# Patient Record
Sex: Female | Born: 1950 | Race: White | Hispanic: No | Marital: Married | Smoking: Current every day smoker
Health system: Southern US, Community
[De-identification: ages and names within clinical notes are randomized; demographics above are authoritative.]

## PROBLEM LIST (undated history)

## (undated) DIAGNOSIS — J189 Pneumonia, unspecified organism: Secondary | ICD-10-CM

## (undated) DIAGNOSIS — Z974 Presence of external hearing-aid: Secondary | ICD-10-CM

## (undated) DIAGNOSIS — F32A Depression, unspecified: Secondary | ICD-10-CM

## (undated) DIAGNOSIS — Z973 Presence of spectacles and contact lenses: Secondary | ICD-10-CM

## (undated) DIAGNOSIS — K529 Noninfective gastroenteritis and colitis, unspecified: Secondary | ICD-10-CM

## (undated) DIAGNOSIS — Z972 Presence of dental prosthetic device (complete) (partial): Secondary | ICD-10-CM

## (undated) DIAGNOSIS — F419 Anxiety disorder, unspecified: Secondary | ICD-10-CM

## (undated) HISTORY — PX: COLONOSCOPY WITH PROPOFOL: SHX5780

---

## 1993-02-25 HISTORY — PX: BREAST CYST ASPIRATION: SHX578

## 1997-02-25 HISTORY — PX: REDUCTION MAMMAPLASTY: SUR839

## 2004-04-25 ENCOUNTER — Ambulatory Visit: Payer: Self-pay | Admitting: Otolaryngology

## 2004-09-26 ENCOUNTER — Ambulatory Visit: Payer: Self-pay | Admitting: Internal Medicine

## 2005-10-02 ENCOUNTER — Ambulatory Visit: Payer: Self-pay | Admitting: Internal Medicine

## 2006-03-10 ENCOUNTER — Ambulatory Visit: Payer: Self-pay | Admitting: Unknown Physician Specialty

## 2006-09-17 ENCOUNTER — Other Ambulatory Visit: Payer: Self-pay

## 2006-09-17 ENCOUNTER — Emergency Department: Payer: Self-pay | Admitting: Emergency Medicine

## 2006-10-06 ENCOUNTER — Ambulatory Visit: Payer: Self-pay | Admitting: Internal Medicine

## 2007-10-07 ENCOUNTER — Ambulatory Visit: Payer: Self-pay | Admitting: Internal Medicine

## 2008-10-17 ENCOUNTER — Ambulatory Visit: Payer: Self-pay | Admitting: Internal Medicine

## 2009-08-22 ENCOUNTER — Ambulatory Visit: Payer: Self-pay | Admitting: Surgery

## 2009-08-22 ENCOUNTER — Ambulatory Visit: Payer: Self-pay | Admitting: Internal Medicine

## 2009-10-23 ENCOUNTER — Ambulatory Visit: Payer: Self-pay | Admitting: Internal Medicine

## 2010-11-06 ENCOUNTER — Ambulatory Visit: Payer: Self-pay | Admitting: Internal Medicine

## 2011-11-12 ENCOUNTER — Ambulatory Visit: Payer: Self-pay | Admitting: Internal Medicine

## 2012-11-17 ENCOUNTER — Ambulatory Visit: Payer: Self-pay | Admitting: Internal Medicine

## 2012-12-18 ENCOUNTER — Ambulatory Visit: Payer: Self-pay | Admitting: Internal Medicine

## 2013-07-05 ENCOUNTER — Ambulatory Visit: Payer: Self-pay | Admitting: Unknown Physician Specialty

## 2013-07-08 LAB — PATHOLOGY REPORT

## 2013-12-21 ENCOUNTER — Ambulatory Visit: Payer: Self-pay | Admitting: Internal Medicine

## 2014-06-10 ENCOUNTER — Other Ambulatory Visit: Payer: Self-pay | Admitting: Internal Medicine

## 2014-06-10 DIAGNOSIS — Z1231 Encounter for screening mammogram for malignant neoplasm of breast: Secondary | ICD-10-CM

## 2014-12-28 ENCOUNTER — Other Ambulatory Visit: Payer: Self-pay | Admitting: Internal Medicine

## 2014-12-28 ENCOUNTER — Ambulatory Visit
Admission: RE | Admit: 2014-12-28 | Discharge: 2014-12-28 | Disposition: A | Discharge status: Home / Self Care | Payer: Medicare Other | Source: Ambulatory Visit | Attending: Internal Medicine | Admitting: Internal Medicine

## 2014-12-28 DIAGNOSIS — Z1231 Encounter for screening mammogram for malignant neoplasm of breast: Secondary | ICD-10-CM

## 2015-01-02 ENCOUNTER — Other Ambulatory Visit: Payer: Self-pay | Admitting: Internal Medicine

## 2015-01-02 DIAGNOSIS — R928 Other abnormal and inconclusive findings on diagnostic imaging of breast: Secondary | ICD-10-CM

## 2015-01-09 ENCOUNTER — Ambulatory Visit
Admission: RE | Admit: 2015-01-09 | Discharge: 2015-01-09 | Disposition: A | Discharge status: Home / Self Care | Payer: Medicare Other | Source: Ambulatory Visit | Attending: Internal Medicine | Admitting: Internal Medicine

## 2015-01-09 DIAGNOSIS — R928 Other abnormal and inconclusive findings on diagnostic imaging of breast: Secondary | ICD-10-CM

## 2015-07-31 ENCOUNTER — Other Ambulatory Visit: Payer: Self-pay | Admitting: Internal Medicine

## 2015-07-31 DIAGNOSIS — Z1231 Encounter for screening mammogram for malignant neoplasm of breast: Secondary | ICD-10-CM

## 2015-08-08 DIAGNOSIS — H7013 Chronic mastoiditis, bilateral: Secondary | ICD-10-CM | POA: Diagnosis not present

## 2015-08-08 DIAGNOSIS — H900 Conductive hearing loss, bilateral: Secondary | ICD-10-CM | POA: Diagnosis not present

## 2016-01-09 ENCOUNTER — Ambulatory Visit: Payer: Medicare Other

## 2016-01-09 ENCOUNTER — Ambulatory Visit
Admission: RE | Admit: 2016-01-09 | Discharge: 2016-01-09 | Disposition: A | Discharge status: Home / Self Care | Payer: PPO | Source: Ambulatory Visit | Attending: Internal Medicine | Admitting: Internal Medicine

## 2016-01-09 DIAGNOSIS — R5382 Chronic fatigue, unspecified: Secondary | ICD-10-CM | POA: Diagnosis not present

## 2016-01-09 DIAGNOSIS — Z79899 Other long term (current) drug therapy: Secondary | ICD-10-CM | POA: Diagnosis not present

## 2016-01-09 DIAGNOSIS — Z1231 Encounter for screening mammogram for malignant neoplasm of breast: Secondary | ICD-10-CM | POA: Diagnosis not present

## 2016-01-09 DIAGNOSIS — R928 Other abnormal and inconclusive findings on diagnostic imaging of breast: Secondary | ICD-10-CM | POA: Diagnosis not present

## 2016-01-11 ENCOUNTER — Other Ambulatory Visit: Payer: Self-pay | Admitting: Internal Medicine

## 2016-01-11 DIAGNOSIS — N6489 Other specified disorders of breast: Secondary | ICD-10-CM

## 2016-01-16 DIAGNOSIS — Z23 Encounter for immunization: Secondary | ICD-10-CM | POA: Diagnosis not present

## 2016-01-16 DIAGNOSIS — Z0000 Encounter for general adult medical examination without abnormal findings: Secondary | ICD-10-CM | POA: Diagnosis not present

## 2016-01-23 ENCOUNTER — Ambulatory Visit
Admission: RE | Admit: 2016-01-23 | Discharge: 2016-01-23 | Disposition: A | Discharge status: Home / Self Care | Payer: PPO | Source: Ambulatory Visit | Attending: Internal Medicine | Admitting: Internal Medicine

## 2016-01-23 DIAGNOSIS — N6489 Other specified disorders of breast: Secondary | ICD-10-CM | POA: Diagnosis not present

## 2016-01-23 DIAGNOSIS — R922 Inconclusive mammogram: Secondary | ICD-10-CM | POA: Diagnosis not present

## 2016-01-24 ENCOUNTER — Other Ambulatory Visit: Payer: Self-pay | Admitting: Internal Medicine

## 2016-01-24 DIAGNOSIS — N6310 Unspecified lump in the right breast, unspecified quadrant: Secondary | ICD-10-CM

## 2016-01-24 DIAGNOSIS — R928 Other abnormal and inconclusive findings on diagnostic imaging of breast: Secondary | ICD-10-CM

## 2016-02-13 ENCOUNTER — Ambulatory Visit
Admission: RE | Admit: 2016-02-13 | Discharge: 2016-02-13 | Disposition: A | Discharge status: Home / Self Care | Payer: PPO | Source: Ambulatory Visit | Attending: Internal Medicine | Admitting: Internal Medicine

## 2016-02-13 DIAGNOSIS — N6310 Unspecified lump in the right breast, unspecified quadrant: Secondary | ICD-10-CM

## 2016-02-13 DIAGNOSIS — R928 Other abnormal and inconclusive findings on diagnostic imaging of breast: Secondary | ICD-10-CM

## 2016-05-22 DIAGNOSIS — M79644 Pain in right finger(s): Secondary | ICD-10-CM | POA: Diagnosis not present

## 2016-05-22 DIAGNOSIS — M19041 Primary osteoarthritis, right hand: Secondary | ICD-10-CM | POA: Diagnosis not present

## 2016-05-29 DIAGNOSIS — M65341 Trigger finger, right ring finger: Secondary | ICD-10-CM | POA: Diagnosis not present

## 2016-06-04 DIAGNOSIS — H5203 Hypermetropia, bilateral: Secondary | ICD-10-CM | POA: Diagnosis not present

## 2016-06-04 DIAGNOSIS — H35363 Drusen (degenerative) of macula, bilateral: Secondary | ICD-10-CM | POA: Diagnosis not present

## 2016-06-04 DIAGNOSIS — H2513 Age-related nuclear cataract, bilateral: Secondary | ICD-10-CM | POA: Diagnosis not present

## 2016-08-07 DIAGNOSIS — K52831 Collagenous colitis: Secondary | ICD-10-CM | POA: Diagnosis not present

## 2016-08-14 ENCOUNTER — Other Ambulatory Visit: Payer: Self-pay | Admitting: Internal Medicine

## 2016-08-14 DIAGNOSIS — N6310 Unspecified lump in the right breast, unspecified quadrant: Secondary | ICD-10-CM

## 2016-09-06 ENCOUNTER — Other Ambulatory Visit: Payer: PPO

## 2016-09-23 ENCOUNTER — Ambulatory Visit
Admission: RE | Admit: 2016-09-23 | Discharge: 2016-09-23 | Disposition: A | Discharge status: Home / Self Care | Payer: PPO | Source: Ambulatory Visit | Attending: Internal Medicine | Admitting: Internal Medicine

## 2016-09-23 DIAGNOSIS — N6310 Unspecified lump in the right breast, unspecified quadrant: Secondary | ICD-10-CM

## 2016-09-23 DIAGNOSIS — N6311 Unspecified lump in the right breast, upper outer quadrant: Secondary | ICD-10-CM | POA: Insufficient documentation

## 2016-09-23 DIAGNOSIS — R922 Inconclusive mammogram: Secondary | ICD-10-CM | POA: Diagnosis not present

## 2016-11-07 ENCOUNTER — Other Ambulatory Visit: Payer: Self-pay | Admitting: Internal Medicine

## 2016-11-07 DIAGNOSIS — N6310 Unspecified lump in the right breast, unspecified quadrant: Secondary | ICD-10-CM

## 2016-11-07 DIAGNOSIS — R928 Other abnormal and inconclusive findings on diagnostic imaging of breast: Secondary | ICD-10-CM

## 2016-11-11 DIAGNOSIS — R928 Other abnormal and inconclusive findings on diagnostic imaging of breast: Secondary | ICD-10-CM | POA: Diagnosis not present

## 2016-11-11 DIAGNOSIS — K52831 Collagenous colitis: Secondary | ICD-10-CM | POA: Diagnosis not present

## 2016-11-12 ENCOUNTER — Other Ambulatory Visit: Payer: Self-pay | Admitting: Internal Medicine

## 2016-11-12 DIAGNOSIS — N6310 Unspecified lump in the right breast, unspecified quadrant: Secondary | ICD-10-CM

## 2017-01-14 ENCOUNTER — Ambulatory Visit
Admission: RE | Admit: 2017-01-14 | Discharge: 2017-01-14 | Disposition: A | Discharge status: Home / Self Care | Payer: PPO | Source: Ambulatory Visit | Attending: Internal Medicine | Admitting: Internal Medicine

## 2017-01-14 ENCOUNTER — Other Ambulatory Visit: Payer: PPO

## 2017-01-14 DIAGNOSIS — N6310 Unspecified lump in the right breast, unspecified quadrant: Secondary | ICD-10-CM

## 2017-01-14 DIAGNOSIS — R922 Inconclusive mammogram: Secondary | ICD-10-CM | POA: Diagnosis not present

## 2017-01-14 DIAGNOSIS — N6489 Other specified disorders of breast: Secondary | ICD-10-CM | POA: Diagnosis not present

## 2017-01-14 DIAGNOSIS — R928 Other abnormal and inconclusive findings on diagnostic imaging of breast: Secondary | ICD-10-CM | POA: Diagnosis not present

## 2017-01-15 DIAGNOSIS — Z0000 Encounter for general adult medical examination without abnormal findings: Secondary | ICD-10-CM | POA: Diagnosis not present

## 2017-01-15 DIAGNOSIS — E782 Mixed hyperlipidemia: Secondary | ICD-10-CM | POA: Diagnosis not present

## 2017-01-22 DIAGNOSIS — E782 Mixed hyperlipidemia: Secondary | ICD-10-CM | POA: Diagnosis not present

## 2017-01-22 DIAGNOSIS — Z79899 Other long term (current) drug therapy: Secondary | ICD-10-CM | POA: Diagnosis not present

## 2017-01-22 DIAGNOSIS — M818 Other osteoporosis without current pathological fracture: Secondary | ICD-10-CM | POA: Diagnosis not present

## 2017-01-22 DIAGNOSIS — Z0000 Encounter for general adult medical examination without abnormal findings: Secondary | ICD-10-CM | POA: Diagnosis not present

## 2017-01-22 DIAGNOSIS — Z23 Encounter for immunization: Secondary | ICD-10-CM | POA: Diagnosis not present

## 2017-01-29 DIAGNOSIS — M818 Other osteoporosis without current pathological fracture: Secondary | ICD-10-CM | POA: Diagnosis not present

## 2017-01-29 DIAGNOSIS — M8588 Other specified disorders of bone density and structure, other site: Secondary | ICD-10-CM | POA: Diagnosis not present

## 2017-01-29 DIAGNOSIS — R0600 Dyspnea, unspecified: Secondary | ICD-10-CM | POA: Diagnosis not present

## 2017-02-27 ENCOUNTER — Other Ambulatory Visit: Payer: Self-pay | Admitting: Internal Medicine

## 2017-02-27 DIAGNOSIS — N63 Unspecified lump in unspecified breast: Secondary | ICD-10-CM

## 2017-04-01 DIAGNOSIS — R21 Rash and other nonspecific skin eruption: Secondary | ICD-10-CM | POA: Diagnosis not present

## 2017-04-01 DIAGNOSIS — K52831 Collagenous colitis: Secondary | ICD-10-CM | POA: Diagnosis not present

## 2017-04-01 DIAGNOSIS — J9801 Acute bronchospasm: Secondary | ICD-10-CM | POA: Diagnosis not present

## 2017-04-08 DIAGNOSIS — J45902 Unspecified asthma with status asthmaticus: Secondary | ICD-10-CM | POA: Diagnosis not present

## 2017-04-08 DIAGNOSIS — J40 Bronchitis, not specified as acute or chronic: Secondary | ICD-10-CM | POA: Diagnosis not present

## 2017-05-12 DIAGNOSIS — L03113 Cellulitis of right upper limb: Secondary | ICD-10-CM | POA: Diagnosis not present

## 2017-05-12 DIAGNOSIS — K52831 Collagenous colitis: Secondary | ICD-10-CM | POA: Diagnosis not present

## 2017-06-04 DIAGNOSIS — D2262 Melanocytic nevi of left upper limb, including shoulder: Secondary | ICD-10-CM | POA: Diagnosis not present

## 2017-06-04 DIAGNOSIS — D2271 Melanocytic nevi of right lower limb, including hip: Secondary | ICD-10-CM | POA: Diagnosis not present

## 2017-06-04 DIAGNOSIS — D2261 Melanocytic nevi of right upper limb, including shoulder: Secondary | ICD-10-CM | POA: Diagnosis not present

## 2017-06-04 DIAGNOSIS — D225 Melanocytic nevi of trunk: Secondary | ICD-10-CM | POA: Diagnosis not present

## 2017-06-04 DIAGNOSIS — L309 Dermatitis, unspecified: Secondary | ICD-10-CM | POA: Diagnosis not present

## 2017-06-04 DIAGNOSIS — D2272 Melanocytic nevi of left lower limb, including hip: Secondary | ICD-10-CM | POA: Diagnosis not present

## 2017-06-04 DIAGNOSIS — D485 Neoplasm of uncertain behavior of skin: Secondary | ICD-10-CM | POA: Diagnosis not present

## 2017-06-04 DIAGNOSIS — L821 Other seborrheic keratosis: Secondary | ICD-10-CM | POA: Diagnosis not present

## 2017-06-04 DIAGNOSIS — C44712 Basal cell carcinoma of skin of right lower limb, including hip: Secondary | ICD-10-CM | POA: Diagnosis not present

## 2017-07-16 DIAGNOSIS — C44712 Basal cell carcinoma of skin of right lower limb, including hip: Secondary | ICD-10-CM | POA: Diagnosis not present

## 2017-07-24 ENCOUNTER — Ambulatory Visit
Admission: RE | Admit: 2017-07-24 | Discharge: 2017-07-24 | Disposition: A | Discharge status: Home / Self Care | Payer: PPO | Source: Ambulatory Visit | Attending: Internal Medicine | Admitting: Internal Medicine

## 2017-07-24 DIAGNOSIS — R922 Inconclusive mammogram: Secondary | ICD-10-CM | POA: Diagnosis not present

## 2017-07-24 DIAGNOSIS — N63 Unspecified lump in unspecified breast: Secondary | ICD-10-CM | POA: Diagnosis not present

## 2017-09-24 DIAGNOSIS — K52831 Collagenous colitis: Secondary | ICD-10-CM | POA: Diagnosis not present

## 2017-10-30 DIAGNOSIS — H7013 Chronic mastoiditis, bilateral: Secondary | ICD-10-CM | POA: Diagnosis not present

## 2017-10-30 DIAGNOSIS — R490 Dysphonia: Secondary | ICD-10-CM | POA: Diagnosis not present

## 2017-10-30 DIAGNOSIS — H900 Conductive hearing loss, bilateral: Secondary | ICD-10-CM | POA: Diagnosis not present

## 2017-11-18 DIAGNOSIS — H7012 Chronic mastoiditis, left ear: Secondary | ICD-10-CM | POA: Diagnosis not present

## 2017-12-17 ENCOUNTER — Other Ambulatory Visit: Payer: Self-pay | Admitting: Internal Medicine

## 2017-12-17 DIAGNOSIS — N6310 Unspecified lump in the right breast, unspecified quadrant: Secondary | ICD-10-CM

## 2018-01-20 ENCOUNTER — Ambulatory Visit
Admission: RE | Admit: 2018-01-20 | Discharge: 2018-01-20 | Disposition: A | Discharge status: Home / Self Care | Payer: PPO | Source: Ambulatory Visit | Attending: Internal Medicine | Admitting: Internal Medicine

## 2018-01-20 DIAGNOSIS — R922 Inconclusive mammogram: Secondary | ICD-10-CM | POA: Diagnosis not present

## 2018-01-20 DIAGNOSIS — N6311 Unspecified lump in the right breast, upper outer quadrant: Secondary | ICD-10-CM | POA: Diagnosis not present

## 2018-01-20 DIAGNOSIS — N6310 Unspecified lump in the right breast, unspecified quadrant: Secondary | ICD-10-CM

## 2018-01-27 DIAGNOSIS — Z79899 Other long term (current) drug therapy: Secondary | ICD-10-CM | POA: Diagnosis not present

## 2018-01-27 DIAGNOSIS — E782 Mixed hyperlipidemia: Secondary | ICD-10-CM | POA: Diagnosis not present

## 2018-01-27 DIAGNOSIS — M818 Other osteoporosis without current pathological fracture: Secondary | ICD-10-CM | POA: Diagnosis not present

## 2018-02-03 DIAGNOSIS — E538 Deficiency of other specified B group vitamins: Secondary | ICD-10-CM | POA: Diagnosis not present

## 2018-02-03 DIAGNOSIS — Z0000 Encounter for general adult medical examination without abnormal findings: Secondary | ICD-10-CM | POA: Diagnosis not present

## 2018-02-03 DIAGNOSIS — K52831 Collagenous colitis: Secondary | ICD-10-CM | POA: Diagnosis not present

## 2018-02-03 DIAGNOSIS — E782 Mixed hyperlipidemia: Secondary | ICD-10-CM | POA: Diagnosis not present

## 2018-05-13 DIAGNOSIS — H7012 Chronic mastoiditis, left ear: Secondary | ICD-10-CM | POA: Diagnosis not present

## 2018-05-13 DIAGNOSIS — H903 Sensorineural hearing loss, bilateral: Secondary | ICD-10-CM | POA: Diagnosis not present

## 2018-07-09 ENCOUNTER — Other Ambulatory Visit: Payer: Self-pay | Admitting: Internal Medicine

## 2018-07-09 DIAGNOSIS — Z1231 Encounter for screening mammogram for malignant neoplasm of breast: Secondary | ICD-10-CM

## 2018-07-16 ENCOUNTER — Other Ambulatory Visit
Admission: RE | Admit: 2018-07-16 | Discharge: 2018-07-16 | Disposition: A | Discharge status: Home / Self Care | Payer: Medicare HMO | Source: Ambulatory Visit | Attending: Student | Admitting: Student

## 2018-07-16 DIAGNOSIS — R197 Diarrhea, unspecified: Secondary | ICD-10-CM | POA: Diagnosis present

## 2018-07-16 LAB — GASTROINTESTINAL PANEL BY PCR, STOOL (REPLACES STOOL CULTURE)

## 2018-07-16 LAB — C DIFFICILE QUICK SCREEN W PCR REFLEX
C Diff antigen: NEGATIVE
C Diff toxin: NEGATIVE

## 2018-07-16 LAB — C DIFFICILE QUICK SCREEN W PCR REFLEX??: C Diff interpretation: NOT DETECTED

## 2018-07-17 LAB — CALPROTECTIN, FECAL: Calprotectin, Fecal: <16 ug/g (ref 0–120)

## 2018-07-21 LAB — PANCREATIC ELASTASE, FECAL: Pancreatic Elastase-1, Stool: 390 ug Elast./g (ref >200)

## 2019-02-02 ENCOUNTER — Ambulatory Visit
Admission: RE | Admit: 2019-02-02 | Discharge: 2019-02-02 | Disposition: A | Discharge status: Home / Self Care | Payer: Medicare HMO | Source: Ambulatory Visit | Attending: Internal Medicine | Admitting: Internal Medicine

## 2019-02-02 DIAGNOSIS — Z1231 Encounter for screening mammogram for malignant neoplasm of breast: Secondary | ICD-10-CM | POA: Insufficient documentation

## 2019-08-24 ENCOUNTER — Other Ambulatory Visit: Payer: Self-pay | Admitting: Internal Medicine

## 2019-08-24 DIAGNOSIS — Z1231 Encounter for screening mammogram for malignant neoplasm of breast: Secondary | ICD-10-CM

## 2019-09-20 IMAGING — MG 2D DIGITAL DIAGNOSTIC BILATERAL MAMMOGRAM WITH CAD AND ADJUNCT T
8 of 13 series · 8 of 29 positions shown · non-contrast
Comparison: Previous exam(s).

CLINICAL DATA: Follow-up of probably benign right breast mass.

EXAM:
2D DIGITAL DIAGNOSTIC BILATERAL MAMMOGRAM WITH CAD AND ADJUNCT TOMO
ULTRASOUND RIGHT BREAST

[R XCCL]
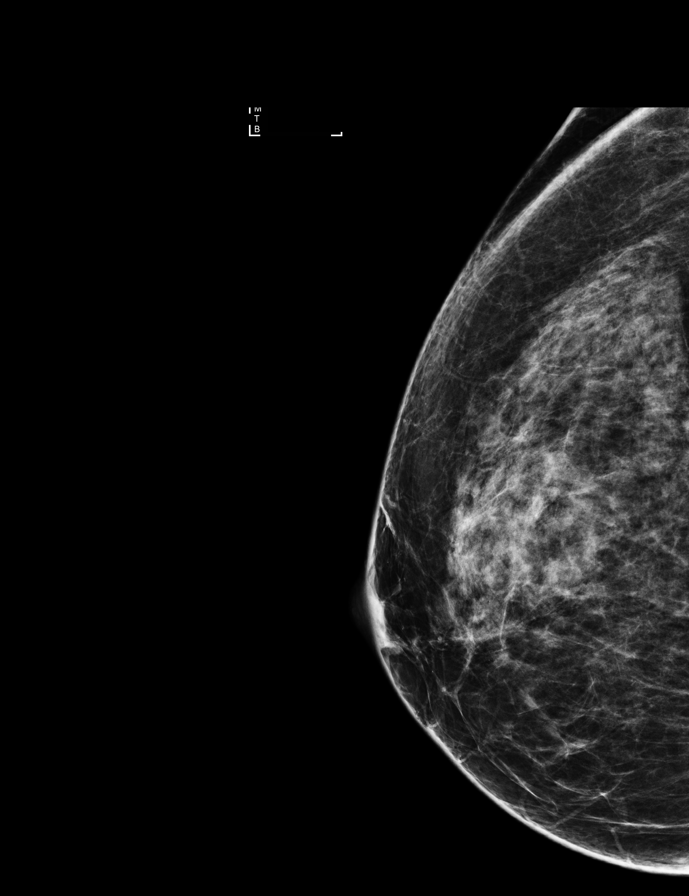

[L CC]
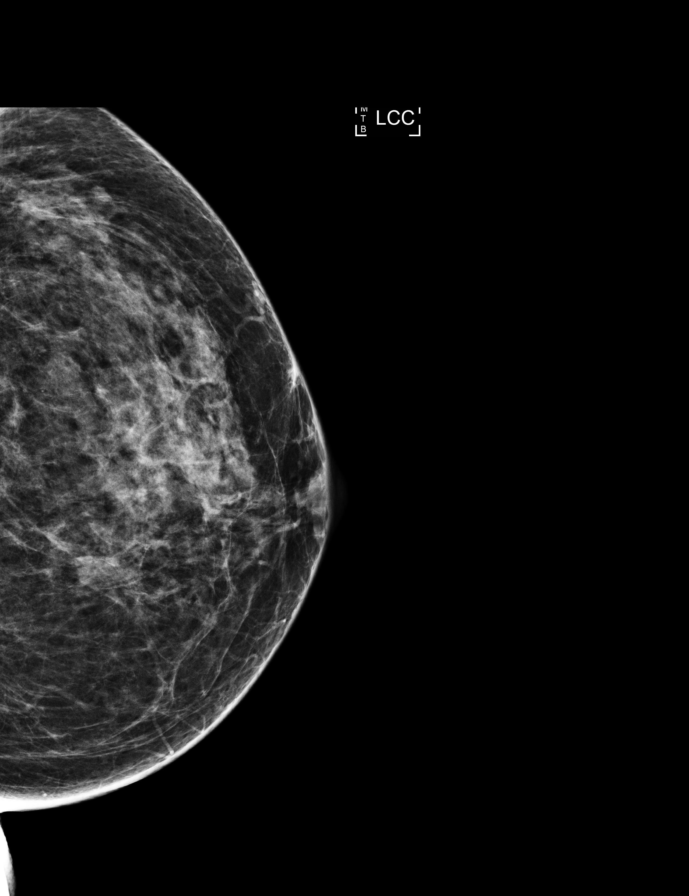

[L MLO synth-2D]
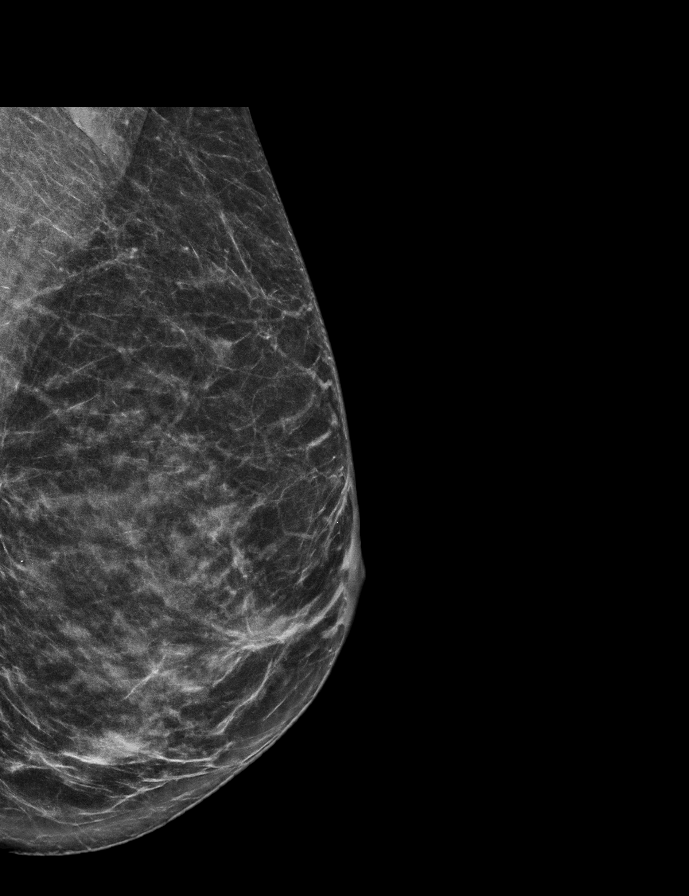

[R CC]
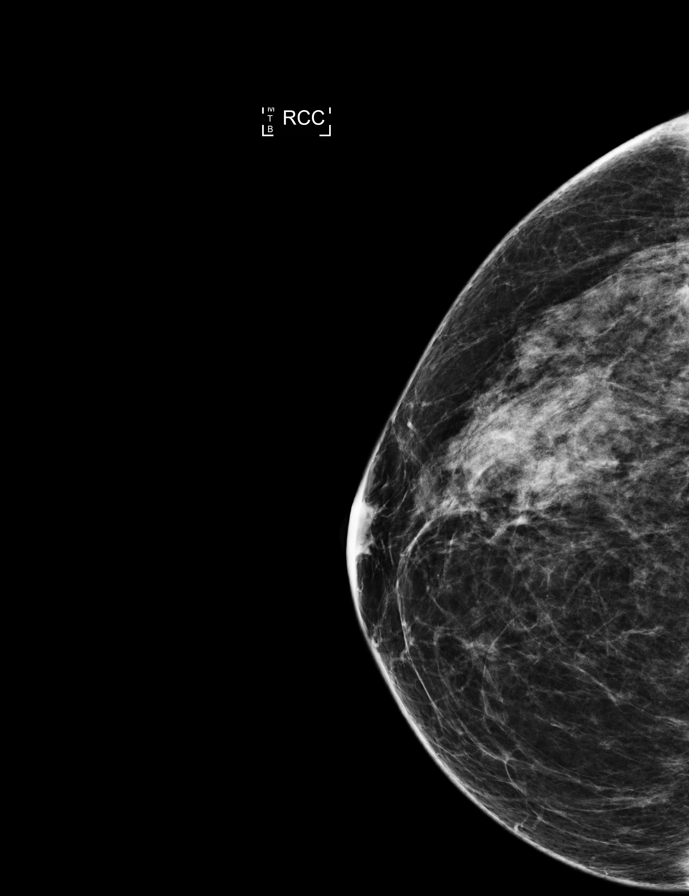

[R MLO]
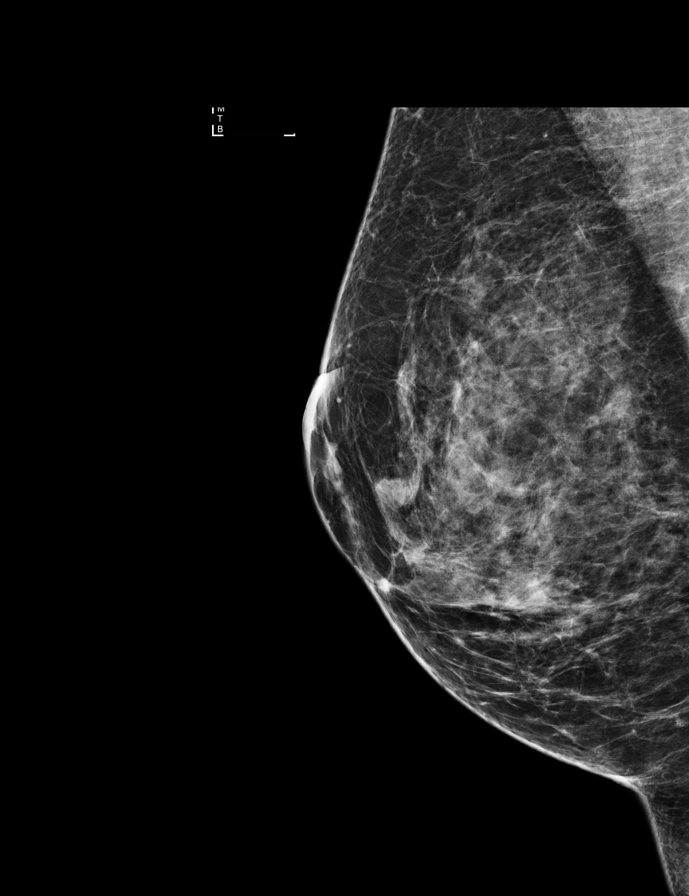

[R MLO synth-2D]
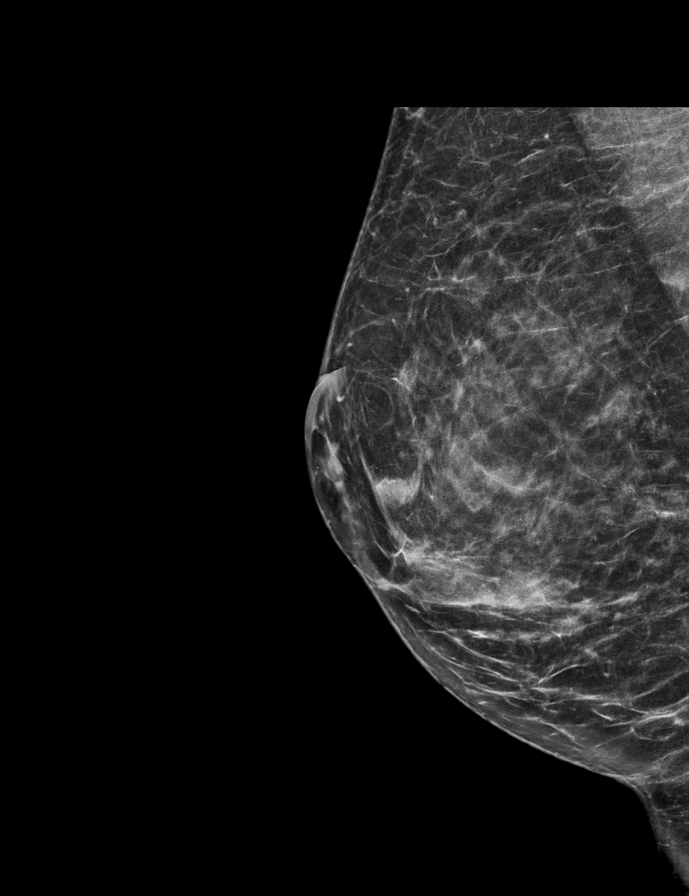

[L CC synth-2D]
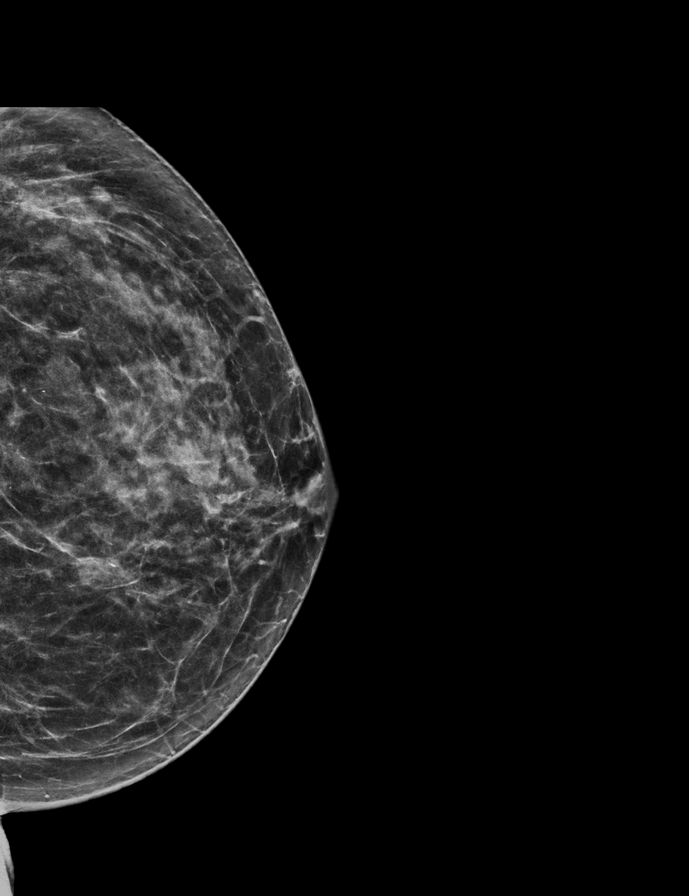

[R CC synth-2D]
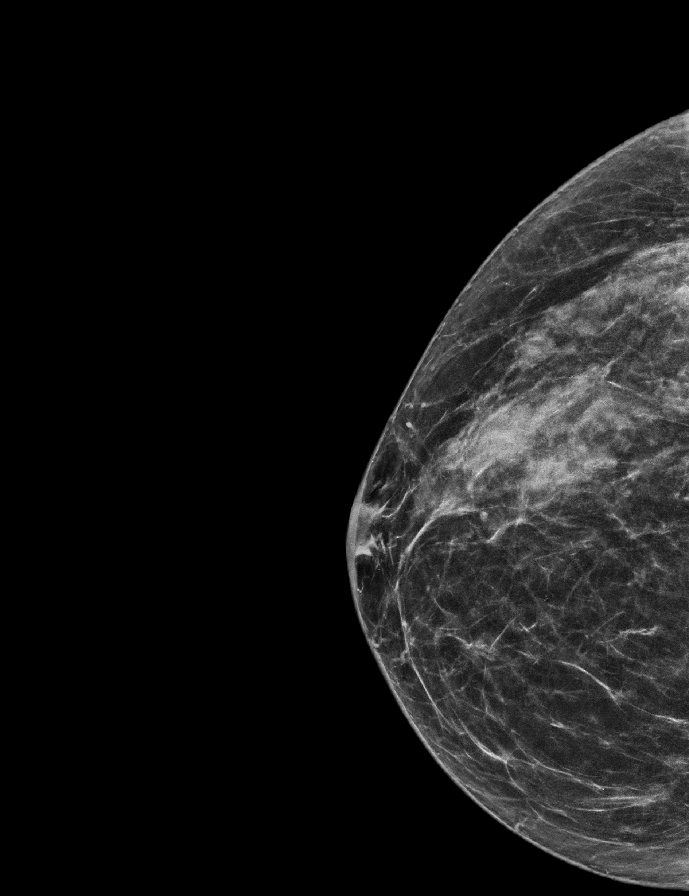

[8 of 29 positions shown; findings below may reference images not displayed]

ACR Breast Density Category c: The breast tissue is heterogeneously
dense, which may obscure small masses.
FINDINGS: Patient has had a bilateral breast reduction mammoplasty. Stable
postsurgical changes. The previously seen asymmetry in the far
lateral, posterior depth, right breast has stable non mass
appearance. No correlation is seen on the orthogonal view.

Mammographic images were processed with CAD.

On physical exam, no suspicious masses are palpated.

Targeted ultrasound is performed, showing no suspicious masses or
shadowing lesions. There is a stable benign-appearing hypoechoic
circumscribed horizontally oriented nodule versus breast lobule in
the right breast 10 o'clock 3 cm from the nipple which measures
by 0.3 by 0.3 cm. It may or may not correspond to the area seen
mammographically.
IMPRESSION: Stable non mass appearing asymmetry in the lateral right breast, far
posterior depth, which given patient's history of reduction
mammoplasty may represent postsurgical scarring.

Stable benign-appearing 6 mm right breast 10 o'clock 3 cm from the
nipple nodule versus breast lobule.

RECOMMENDATION:
Diagnostic mammogram and possibly ultrasound of the right breast in
6 months. (Code:W4-8-5W4)

I have discussed the findings and recommendations with the patient.
Results were also provided in writing at the conclusion of the
visit. If applicable, a reminder letter will be sent to the patient
regarding the next appointment.

BI-RADS CATEGORY  3: Probably benign.

## 2019-11-08 ENCOUNTER — Ambulatory Visit: Payer: Self-pay | Attending: Internal Medicine

## 2019-11-08 DIAGNOSIS — Z23 Encounter for immunization: Secondary | ICD-10-CM

## 2019-11-08 NOTE — Progress Notes (Signed)
   Covid-19 Vaccination Clinic  Name:  Samhita Kretsch    MRN: [id number] DOB: 1950-12-29  11/08/2019  Ms. Schader was observed post Covid-19 immunization for 15 minutes without incident. She was provided with Vaccine Information Sheet and instruction to access the V-Safe system.   Ms. Gatchel was instructed to call 911 with any severe reactions post vaccine: Marland Kitchen Difficulty breathing  . Swelling of face and throat  . A fast heartbeat  . A bad rash all over body  . Dizziness and weakness

## 2020-02-08 ENCOUNTER — Other Ambulatory Visit: Payer: Self-pay

## 2020-02-08 ENCOUNTER — Ambulatory Visit
Admission: RE | Admit: 2020-02-08 | Discharge: 2020-02-08 | Disposition: A | Discharge status: Home / Self Care | Payer: Medicare HMO | Source: Ambulatory Visit | Attending: Internal Medicine | Admitting: Internal Medicine

## 2020-02-08 DIAGNOSIS — Z1231 Encounter for screening mammogram for malignant neoplasm of breast: Secondary | ICD-10-CM

## 2020-09-26 ENCOUNTER — Other Ambulatory Visit: Payer: Self-pay | Admitting: Internal Medicine

## 2020-09-26 DIAGNOSIS — Z1231 Encounter for screening mammogram for malignant neoplasm of breast: Secondary | ICD-10-CM

## 2021-02-13 ENCOUNTER — Ambulatory Visit
Admission: RE | Admit: 2021-02-13 | Discharge: 2021-02-13 | Disposition: A | Discharge status: Home / Self Care | Payer: Medicare HMO | Source: Ambulatory Visit | Attending: Internal Medicine | Admitting: Internal Medicine

## 2021-02-13 ENCOUNTER — Other Ambulatory Visit: Payer: Self-pay

## 2021-02-13 DIAGNOSIS — Z1231 Encounter for screening mammogram for malignant neoplasm of breast: Secondary | ICD-10-CM | POA: Diagnosis present

## 2021-09-12 ENCOUNTER — Other Ambulatory Visit: Payer: Self-pay | Admitting: Internal Medicine

## 2021-09-12 DIAGNOSIS — Z1231 Encounter for screening mammogram for malignant neoplasm of breast: Secondary | ICD-10-CM

## 2022-03-12 ENCOUNTER — Ambulatory Visit
Admission: RE | Admit: 2022-03-12 | Discharge: 2022-03-12 | Disposition: A | Discharge status: Home / Self Care | Payer: Medicare HMO | Source: Ambulatory Visit | Attending: Internal Medicine | Admitting: Internal Medicine

## 2022-03-12 DIAGNOSIS — Z1231 Encounter for screening mammogram for malignant neoplasm of breast: Secondary | ICD-10-CM | POA: Insufficient documentation

## 2022-04-09 ENCOUNTER — Encounter: Payer: Self-pay | Admitting: Internal Medicine

## 2022-04-10 ENCOUNTER — Ambulatory Visit: Payer: Medicare HMO | Admitting: Anesthesiology

## 2022-04-10 ENCOUNTER — Ambulatory Visit
Admission: RE | Admit: 2022-04-10 | Discharge: 2022-04-10 | Disposition: A | Discharge status: Home / Self Care | Payer: Medicare HMO | Attending: Internal Medicine | Admitting: Internal Medicine

## 2022-04-10 ENCOUNTER — Encounter
Admission: RE | Disposition: A | Discharge status: Home / Self Care | Payer: Self-pay | Source: Home / Self Care | Attending: Internal Medicine

## 2022-04-10 ENCOUNTER — Encounter: Payer: Self-pay | Admitting: Internal Medicine

## 2022-04-10 DIAGNOSIS — R197 Diarrhea, unspecified: Secondary | ICD-10-CM | POA: Diagnosis not present

## 2022-04-10 DIAGNOSIS — F419 Anxiety disorder, unspecified: Secondary | ICD-10-CM | POA: Diagnosis not present

## 2022-04-10 DIAGNOSIS — K5730 Diverticulosis of large intestine without perforation or abscess without bleeding: Secondary | ICD-10-CM | POA: Diagnosis not present

## 2022-04-10 DIAGNOSIS — F32A Depression, unspecified: Secondary | ICD-10-CM | POA: Diagnosis not present

## 2022-04-10 DIAGNOSIS — Z87891 Personal history of nicotine dependence: Secondary | ICD-10-CM | POA: Diagnosis not present

## 2022-04-10 DIAGNOSIS — K52831 Collagenous colitis: Secondary | ICD-10-CM | POA: Diagnosis present

## 2022-04-10 DIAGNOSIS — D124 Benign neoplasm of descending colon: Secondary | ICD-10-CM | POA: Insufficient documentation

## 2022-04-10 DIAGNOSIS — K640 First degree hemorrhoids: Secondary | ICD-10-CM | POA: Insufficient documentation

## 2022-04-10 HISTORY — DX: Anxiety disorder, unspecified: F41.9

## 2022-04-10 HISTORY — PX: COLONOSCOPY: SHX5424

## 2022-04-10 HISTORY — DX: Depression, unspecified: F32.A

## 2022-04-10 SURGERY — COLONOSCOPY
Anesthesia: General

## 2022-04-10 MED ORDER — PROPOFOL 500 MG/50ML IV EMUL
INTRAVENOUS | Status: DC | PRN
  Administered 2022-04-10: 150 ug/kg/min via INTRAVENOUS

## 2022-04-10 MED ORDER — PROPOFOL 10 MG/ML IV BOLUS
INTRAVENOUS | Status: DC | PRN
  Administered 2022-04-10: 100 mg via INTRAVENOUS
  Administered 2022-04-10: 30 mg via INTRAVENOUS
  Administered 2022-04-10 (×2): 50 mg via INTRAVENOUS

## 2022-04-10 MED ORDER — LIDOCAINE HCL (CARDIAC) PF 100 MG/5ML IV SOSY
PREFILLED_SYRINGE | INTRAVENOUS | Status: DC | PRN
  Administered 2022-04-10: 100 mg via INTRAVENOUS

## 2022-04-10 MED ORDER — SODIUM CHLORIDE 0.9 % IV SOLN: INTRAVENOUS | Status: DC

## 2022-04-10 NOTE — Op Note (Signed)
[facility] Patient Name: Heidi Knight Procedure Date: 04/10/2022 11:36 AM MRN: [id number] Account #: [id number] Date of Birth: 1951-01-30 Admit Type: Outpatient Age: 72 Room: [facility] ENDO ROOM 2 Gender: Female Note Status: Finalized Instrument Name: Colonoscope [id number] Procedure:             Colonoscopy Indications:           Collagenous colitis Providers:             Lorie Apley K. Ky Rumple MD, MD Medicines:             Propofol per Anesthesia Complications:         No immediate complications. Estimated blood loss:                         Minimal. Procedure:             Pre-Anesthesia Assessment:                        - The risks and benefits of the procedure and the                         sedation options and risks were discussed with the                         patient. All questions were answered and informed                         consent was obtained.                        - Patient identification and proposed procedure were                         verified prior to the procedure by the nurse. The                         procedure was verified in the procedure room.                        - ASA Grade Assessment: II - A patient with mild                         systemic disease.                        - After reviewing the risks and benefits, the patient                         was deemed in satisfactory condition to undergo the                         procedure.                        After obtaining informed consent, the colonoscope was                         passed under direct vision. Throughout the procedure,  the patient's blood pressure, pulse, and oxygen                         saturations were monitored continuously. The                         Colonoscope was introduced through the anus and                         advanced to the the cecum, identified by appendiceal                         orifice and ileocecal  valve. The colonoscopy was                         performed without difficulty. The patient tolerated                         the procedure well. The quality of the bowel                         preparation was good. The ileocecal valve, appendiceal                         orifice, and rectum were photographed. Findings:      The perianal and digital rectal examinations were normal. Pertinent       negatives include normal sphincter tone and no palpable rectal lesions.      Non-bleeding internal hemorrhoids were found during retroflexion. The       hemorrhoids were Grade I (internal hemorrhoids that do not prolapse).      A few small-mouthed diverticula were found in the sigmoid colon.      A 4 mm polyp was found in the descending colon. The polyp was sessile.       The polyp was removed with a jumbo cold forceps. Resection and retrieval       were complete.      The exam was otherwise without abnormality.      Biopsies for histology were taken with a cold forceps from the random       colon for evaluation of microscopic colitis. Impression:            - Non-bleeding internal hemorrhoids.                        - Diverticulosis in the sigmoid colon.                        - One 4 mm polyp in the descending colon, removed with                         a jumbo cold forceps. Resected and retrieved.                        - The examination was otherwise normal.                        - Biopsies were taken with a cold forceps from the  random colon for evaluation of microscopic colitis. Recommendation:        - Patient has a contact number available for                         emergencies. The signs and symptoms of potential                         delayed complications were discussed with the patient.                         Return to normal activities tomorrow. Written                         discharge instructions were provided to the patient.                         - Resume previous diet.                        - Continue present medications.                        - Await pathology results.                        - Return to nurse practitioner in 3 months.                        - Telephone GI office to schedule appointment.                        - Follow up with Dawson Bills, NP at [facility]                         Gastroenterology. Procedure Code(s):     --- Professional ---                        [phone], Colonoscopy, flexible; with biopsy, single or                         multiple Diagnosis Code(s):     --- Professional ---                        K57.30, Diverticulosis of large intestine without                         perforation or abscess without bleeding                        D12.4, Benign neoplasm of descending colon                        K64.0, First degree hemorrhoids CPT copyright 2022 American [employer] Association. All rights reserved. The codes documented in this report are preliminary and upon coder review may  be revised to meet current compliance requirements. Efrain Sella MD, MD 04/10/2022 12:20:27 PM This report has been signed electronically. Number of Addenda: 0 Note Initiated On: 04/10/2022 11:36 AM Scope Withdrawal Time: 0 hours 13 minutes 27 seconds  Total Procedure  Duration: 0 hours 21 minutes 19 seconds  Estimated Blood Loss:  Estimated blood loss was minimal.      [facility]

## 2022-04-10 NOTE — Anesthesia Preprocedure Evaluation (Addendum)
Anesthesia Evaluation  Patient identified by MRN, date of birth, ID band Patient awake    Reviewed: Allergy & Precautions, NPO status , Patient's Chart, lab work & pertinent test results  Airway Mallampati: III  TM Distance: >3 FB Neck ROM: full    Dental  (+) Teeth Intact   Pulmonary neg pulmonary ROS, former smoker   Pulmonary exam normal        Cardiovascular Exercise Tolerance: Good negative cardio ROS Normal cardiovascular exam Rhythm:Regular Rate:Normal     Neuro/Psych   Anxiety Depression    negative neurological ROS  negative psych ROS   GI/Hepatic negative GI ROS, Neg liver ROS,,,  Endo/Other  negative endocrine ROS    Renal/GU negative Renal ROS  negative genitourinary   Musculoskeletal   Abdominal Normal abdominal exam  (+)   Peds negative pediatric ROS (+)  Hematology negative hematology ROS (+)   Anesthesia Other Findings Past Medical History: No date: Anxiety No date: Depression  Past Surgical History: 1995: BREAST CYST ASPIRATION; Left     Comment:  neg No date: COLONOSCOPY WITH PROPOFOL 1999: REDUCTION MAMMAPLASTY  BMI    Body Mass Index: 21.16 kg/m      Reproductive/Obstetrics negative OB ROS                             Anesthesia Physical Anesthesia Plan  ASA: 2  Anesthesia Plan: General   Post-op Pain Management:    Induction: Intravenous  PONV Risk Score and Plan: Propofol infusion and TIVA  Airway Management Planned: Natural Airway and Nasal Cannula  Additional Equipment:   Intra-op Plan:   Post-operative Plan:   Informed Consent: I have reviewed the patients History and Physical, chart, labs and discussed the procedure including the risks, benefits and alternatives for the proposed anesthesia with the patient or authorized representative who has indicated his/her understanding and acceptance.     Dental Advisory Given  Plan Discussed  with: CRNA and Surgeon  Anesthesia Plan Comments:        Anesthesia Quick Evaluation

## 2022-04-10 NOTE — Transfer of Care (Signed)
Immediate Anesthesia Transfer of Care Note  Patient: Heidi Knight  Procedure(s) Performed: COLONOSCOPY  Patient Location: Endoscopy Unit  Anesthesia Type:General  Level of Consciousness: drowsy  Airway & Oxygen Therapy: Patient Spontanous Breathing  Post-op Assessment: Report given to RN and Post -op Vital signs reviewed and stable  Post vital signs: Reviewed and stable  Last Vitals:  Vitals Value Taken Time  BP 104/46 04/10/22 1221  Temp    Pulse 72 04/10/22 1222  Resp    SpO2 97 % 04/10/22 1222  Vitals shown include unvalidated device data.  Last Pain:  Vitals:   04/10/22 1059  TempSrc: Temporal  PainSc: 0-No pain         Complications: No notable events documented.

## 2022-04-10 NOTE — H&P (Signed)
Outpatient short stay form Pre-procedure 04/10/2022 11:40 AM Heidi Knight Heidi Knight, M.D.  Primary Physician: Emily Filbert, M.D.  Reason for visit:  Hx collagenous colitis  History of present illness:  72 y/o female with hx of diarrhea and collagenous colitis, Diarrhea continues despite budeson    Current Facility-Administered Medications:    0.9 %  sodium chloride infusion, , Intravenous, Continuous, Heidi Knight, Heidi Pike, MD, Last Rate: 20 mL/hr at 04/10/22 1106, New Bag at 04/10/22 1106  Medications Prior to Admission  Medication Sig Dispense Refill Last Dose   cholecalciferol (VITAMIN D3) 10 MCG (400 UNIT) TABS tablet Take 400 Units by mouth.      cyanocobalamin (VITAMIN B12) 1000 MCG tablet Take 1,000 mcg by mouth daily.      Melatonin 10 MG CAPS Take by mouth.      pantoprazole (PROTONIX) 40 MG tablet Take 40 mg by mouth daily.   Past Week   sertraline (ZOLOFT) 50 MG tablet Take 50 mg by mouth daily.   Past Week     Allergies  Allergen Reactions   Doxycycline      Past Medical History:  Diagnosis Date   Anxiety    Depression     Review of systems:  Otherwise negative.    Physical Exam  Gen: Alert, oriented. Appears stated age.  HEENT: [name]/AT. PERRLA. Lungs: CTA, no wheezes. CV: RR nl S1, S2. Abd: soft, benign, no masses. BS+ Ext: No edema. Pulses 2+    Planned procedures: Proceed with colonoscopy. The patient understands the nature of the planned procedure, indications, risks, alternatives and potential complications including but not limited to bleeding, infection, perforation, damage to internal organs and possible oversedation/side effects from anesthesia. The patient agrees and gives consent to proceed.  Please refer to procedure notes for findings, recommendations and patient disposition/instructions.     Ramaya Guile Heidi Knight, M.D. Gastroenterology 04/10/2022  11:40 AM

## 2022-04-10 NOTE — Anesthesia Postprocedure Evaluation (Signed)
Anesthesia Post Note  Patient: Heidi Knight  Procedure(s) Performed: COLONOSCOPY  Patient location during evaluation: PACU Anesthesia Type: General Level of consciousness: awake and awake and alert Pain management: pain level controlled Vital Signs Assessment: post-procedure vital signs reviewed and stable Respiratory status: spontaneous breathing and respiratory function stable Cardiovascular status: stable Anesthetic complications: no   No notable events documented.   Last Vitals:  Vitals:   04/10/22 1231 04/10/22 1241  BP: (!) 91/49 128/77  Pulse: 74 76  Resp: 15 14  Temp:    SpO2: 97% 100%    Last Pain:  Vitals:   04/10/22 1241  TempSrc:   PainSc: 0-No pain                 VAN STAVEREN,Luigi Stuckey

## 2022-04-11 ENCOUNTER — Encounter: Payer: Self-pay | Admitting: Internal Medicine

## 2022-04-11 LAB — SURGICAL PATHOLOGY

## 2022-08-02 ENCOUNTER — Emergency Department: Payer: Medicare HMO

## 2022-08-02 ENCOUNTER — Emergency Department
Admission: EM | Admit: 2022-08-02 | Discharge: 2022-08-02 | Disposition: A | Discharge status: Home / Self Care | Payer: Medicare HMO | Attending: Emergency Medicine | Admitting: Emergency Medicine

## 2022-08-02 DIAGNOSIS — R5383 Other fatigue: Secondary | ICD-10-CM | POA: Diagnosis not present

## 2022-08-02 DIAGNOSIS — Q632 Ectopic kidney: Secondary | ICD-10-CM | POA: Insufficient documentation

## 2022-08-02 DIAGNOSIS — R197 Diarrhea, unspecified: Secondary | ICD-10-CM

## 2022-08-02 DIAGNOSIS — N261 Atrophy of kidney (terminal): Secondary | ICD-10-CM | POA: Insufficient documentation

## 2022-08-02 DIAGNOSIS — R7989 Other specified abnormal findings of blood chemistry: Secondary | ICD-10-CM

## 2022-08-02 LAB — LIPASE, BLOOD: Lipase: 50 U/L (ref 11–51)

## 2022-08-02 LAB — TSH: TSH: 0.982 u[IU]/mL (ref 0.350–4.500)

## 2022-08-02 MED ORDER — ONDANSETRON 4 MG PO TBDP: 4.0000 mg | ORAL_TABLET | Freq: Three times a day (TID) | ORAL | 0 refills | Status: AC | PRN

## 2022-08-02 MED ORDER — SODIUM CHLORIDE 0.9 % IV BOLUS
500.0000 mL | Freq: Once | INTRAVENOUS | Status: AC
  Administered 2022-08-02: 500 mL via INTRAVENOUS

## 2022-08-02 MED ORDER — IOHEXOL 300 MG/ML  SOLN
80.0000 mL | Freq: Once | INTRAMUSCULAR | Status: AC | PRN
  Administered 2022-08-02: 80 mL via INTRAVENOUS

## 2022-08-02 NOTE — ED Triage Notes (Signed)
First RN note:  Pt from [facility], coming in with nausea, vomiting and diarrhea with a decreased appetite. AST and ALT noted to be elevated.   Blood work completed today.

## 2022-08-02 NOTE — ED Triage Notes (Signed)
See first nurse note. 

## 2022-08-02 NOTE — ED Provider Notes (Signed)
[facility] Provider Note    Event Date/Time   First MD Initiated Contact with Patient 08/02/22 1406     (approximate)   History   Abnormal Labs   HPI  Heidi Knight is a 72 y.o. female with a history of collagenous colitis and osteoporosis who presents with diarrhea and fatigue over the last 1.5 weeks.  The patient states that she initially had some vomiting as well but this has resolved.  She reports generalized fatigue.  The diarrhea is watery and nonbloody.  It has improved slightly with a dose of budesonide.  The patient states that previously her episodes of colitis resolved with either the budesonide or Imodium, however this episode has persisted.  She has not had any significant associated abdominal pain, just some "gas pains."  She also has an itchy rash to her back for the last several days.  She denies any rash to the palms or soles.  The patient was seen at the [facility] clinic and sent into the ED due to elevated LFTs and alkaline phosphatase.  I reviewed the past medical records.  The CMP from earlier today shows AST 106, ALT 114, and alk phos 126.  Prior to today, the patient's most recent outpatient encounter was with internal medicine on 5/17 for viral syndrome.   Physical Exam   Triage Vital Signs: ED Triage Vitals  Enc Vitals Group     BP 08/02/22 1030 (!) 144/80     Pulse Rate 08/02/22 1030 84     Resp 08/02/22 1030 16     Temp 08/02/22 1030 98 F (36.7 C)     Temp Source 08/02/22 1030 Oral     SpO2 08/02/22 1030 97 %     Weight 08/02/22 1029 110 lb (49.9 kg)     Height --      Head Circumference --      Peak Flow --      Pain Score 08/02/22 1030 0     Pain Loc --      Pain Edu? --      Excl. in GC? --     Most recent vital signs: Vitals:   08/02/22 1030 08/02/22 1439  BP: (!) 144/80 138/80  Pulse: 84 81  Resp: 16   Temp: 98 F (36.7 C)   SpO2: 97% 95%     General: Awake, no distress.  CV:  Good peripheral perfusion.   Resp:  Normal effort.  Abd:  Soft and nontender.  No distention.  Other:  Dry mucous membranes.  Scattered papular rash to the back, blanching and nontender.  No palmar involvement.   ED Results / Procedures / Treatments   Labs (all labs ordered are listed, but only abnormal results are displayed) Labs Reviewed  LIPASE, BLOOD  TSH     EKG  ED ECG REPORT I, Dionne Bucy, the attending physician, personally viewed and interpreted this ECG.  Date: 08/02/2022 EKG Time: 1532 Rate: 76 Rhythm: normal sinus rhythm QRS Axis: normal Intervals: normal ST/T Wave abnormalities: normal Narrative Interpretation: no evidence of acute ischemia    RADIOLOGY  CT abdomen/pelvis: Pending  US abdomen RUQ: Pending   PROCEDURES:  Critical Care performed: No  Procedures   MEDICATIONS ORDERED IN ED: Medications  iohexol (OMNIPAQUE) 300 MG/ML solution 80 mL (has no administration in time range)  sodium chloride 0.9 % bolus 500 mL (500 mLs Intravenous New Bag/Given 08/02/22 1512)     IMPRESSION / MDM / ASSESSMENT AND PLAN /  ED COURSE  I reviewed the triage vital signs and the nursing notes.  72 year old female with PMH as noted above presents with diarrhea, nausea, fatigue, and decreased appetite over the last 1.5 weeks.  She was sent in from the outpatient clinic due to elevated LFTs.  On exam the vital signs are normal and the abdomen is soft and nontender.  The patient does have a papular rash to the back which is blanching and nontender.  Differential diagnosis includes, but is not limited to, colitis, diverticulitis, gastroenteritis, IBD, IBS, viral syndrome, acute hepatitis, choledocholithiasis, other hepatobiliary cause.  Differential for the fatigue includes dehydration, less likely hypothyroidism or other endocrine etiology.  Rash is most consistent with nonspecific dermatitis and does not demonstrate any red flag features.  We will add on lipase and TSH, obtain ultrasound  and CT abdomen/pelvis, give fluids, and reassess.  Patient's presentation is most consistent with acute complicated illness / injury requiring diagnostic workup.  ----------------------------------------- 3:20 PM on 08/02/2022 -----------------------------------------  Workup is pending.  I have signed the patient out to the oncoming ED physician Dr. Lenard Lance.   FINAL CLINICAL IMPRESSION(S) / ED DIAGNOSES   Final diagnoses:  Diarrhea, unspecified type  Elevated LFTs     Rx / DC Orders   ED Discharge Orders     None        Note:  This document was prepared using Dragon voice recognition software and may include unintentional dictation errors.    Dionne Bucy, MD 08/02/22 [phone]

## 2022-08-02 NOTE — ED Provider Notes (Signed)
-----------------------------------------   6:06 PM on 08/02/2022 ----------------------------------------- Patient care assumed from Dr. Marisa Severin.  Patient CT scan shows no significant finding besides a small cystic area in the pancreas recommend she follow-up with her doctor for an MRI.  Patient's ultrasound shows no significant finding.  Patient's outpatient labs did show elevated LFTs but the patient has an ongoing GI illness.  States that GI illness is improving.  Lipase is normal TSH is normal.  Discussed with patient the importance of taking in plenty of calories and plenty of IV hydration over the next week to follow-up with her doctor in 2 weeks for recheck of her liver function tests as she recovers from this GI illness.  Discussed return precautions if the patient seems to worsen if weakness worsens or the patient is unable to keep down adequate fluids or calories.  Patient agreeable to plan of care.   Minna Antis, MD 08/02/22 1807

## 2022-08-02 NOTE — Discharge Instructions (Addendum)
As we discussed please drink a sufficient amount of fluids and eat a sufficient amount of calories for the next week plus.  Please follow-up with your doctor for recheck of your liver function test in 2 weeks.  Return to the emergency department for any worsening weakness development of significant abdominal pain fever or any other symptom personally concerning to yourself.  Please use your nausea medication as needed.  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  This should be followed up with an MRI in the near future as a precaution.

## 2023-01-14 ENCOUNTER — Encounter: Payer: Self-pay | Admitting: Ophthalmology

## 2023-01-17 NOTE — Anesthesia Preprocedure Evaluation (Signed)
Anesthesia Evaluation  Patient identified by MRN, date of birth, ID band Patient awake    Reviewed: Allergy & Precautions, H&P , NPO status , Patient's Chart, lab work & pertinent test results  Airway Mallampati: III  TM Distance: >3 FB     Dental   2 permanent Dental bridges and an implant right lower jaw :   Pulmonary neg pulmonary ROS, pneumonia, Current Smoker and Patient abstained from smoking.          Cardiovascular negative cardio ROS      Neuro/Psych  PSYCHIATRIC DISORDERS Anxiety Depression    negative neurological ROS  negative psych ROS   GI/Hepatic negative GI ROS, Neg liver ROS,,,  Endo/Other  negative endocrine ROS    Renal/GU negative Renal ROS  negative genitourinary   Musculoskeletal negative musculoskeletal ROS (+)    Abdominal   Peds negative pediatric ROS (+)  Hematology negative hematology ROS (+)   Anesthesia Other Findings Anxiety  Depression Colitis  Presence of dental bridge Pneumonia  Wears contact lenses Wears hearing aid in left ear     Reproductive/Obstetrics negative OB ROS                             Anesthesia Physical Anesthesia Plan  ASA: 2  Anesthesia Plan: MAC   Post-op Pain Management:    Induction: Intravenous  PONV Risk Score and Plan:   Airway Management Planned: Natural Airway and Nasal Cannula  Additional Equipment:   Intra-op Plan:   Post-operative Plan:   Informed Consent: I have reviewed the patients History and Physical, chart, labs and discussed the procedure including the risks, benefits and alternatives for the proposed anesthesia with the patient or authorized representative who has indicated his/her understanding and acceptance.     Dental Advisory Given  Plan Discussed with: Anesthesiologist, CRNA and Surgeon  Anesthesia Plan Comments: (Patient consented for risks of anesthesia including but not limited to:   - adverse reactions to medications - damage to eyes, teeth, lips or other oral mucosa - nerve damage due to positioning  - sore throat or hoarseness - Damage to heart, brain, nerves, lungs, other parts of body or loss of life  Patient voiced understanding and assent.)       Anesthesia Quick Evaluation

## 2023-01-17 NOTE — Discharge Instructions (Signed)

## 2023-01-20 ENCOUNTER — Encounter: Payer: Self-pay | Admitting: Ophthalmology

## 2023-01-20 ENCOUNTER — Ambulatory Visit: Payer: Medicare HMO | Admitting: Anesthesiology

## 2023-01-20 ENCOUNTER — Other Ambulatory Visit: Payer: Self-pay

## 2023-01-20 ENCOUNTER — Ambulatory Visit: Payer: Self-pay | Admitting: Anesthesiology

## 2023-01-20 ENCOUNTER — Ambulatory Visit
Admission: RE | Admit: 2023-01-20 | Discharge: 2023-01-20 | Disposition: A | Discharge status: Home / Self Care | Payer: Medicare HMO | Attending: Ophthalmology | Admitting: Ophthalmology

## 2023-01-20 ENCOUNTER — Encounter
Admission: RE | Disposition: A | Discharge status: Home / Self Care | Payer: Self-pay | Source: Home / Self Care | Attending: Ophthalmology

## 2023-01-20 DIAGNOSIS — F419 Anxiety disorder, unspecified: Secondary | ICD-10-CM | POA: Diagnosis not present

## 2023-01-20 DIAGNOSIS — Z79890 Hormone replacement therapy (postmenopausal): Secondary | ICD-10-CM | POA: Insufficient documentation

## 2023-01-20 DIAGNOSIS — Z79899 Other long term (current) drug therapy: Secondary | ICD-10-CM | POA: Insufficient documentation

## 2023-01-20 DIAGNOSIS — F17210 Nicotine dependence, cigarettes, uncomplicated: Secondary | ICD-10-CM | POA: Insufficient documentation

## 2023-01-20 DIAGNOSIS — H2512 Age-related nuclear cataract, left eye: Secondary | ICD-10-CM | POA: Insufficient documentation

## 2023-01-20 DIAGNOSIS — F32A Depression, unspecified: Secondary | ICD-10-CM | POA: Insufficient documentation

## 2023-01-20 HISTORY — DX: Presence of external hearing-aid: Z97.4

## 2023-01-20 HISTORY — DX: Presence of spectacles and contact lenses: Z97.3

## 2023-01-20 HISTORY — DX: Noninfective gastroenteritis and colitis, unspecified: K52.9

## 2023-01-20 HISTORY — DX: Presence of dental prosthetic device (complete) (partial): Z97.2

## 2023-01-20 HISTORY — PX: CATARACT EXTRACTION W/PHACO: SHX586

## 2023-01-20 HISTORY — DX: Pneumonia, unspecified organism: J18.9

## 2023-01-20 SURGERY — PHACOEMULSIFICATION, CATARACT, WITH IOL INSERTION
Anesthesia: Monitor Anesthesia Care | Site: Eye | Laterality: Left

## 2023-01-20 MED ORDER — SIGHTPATH DOSE#1 BSS IO SOLN
INTRAOCULAR | Status: DC | PRN
  Administered 2023-01-20: 15 mL

## 2023-01-20 MED ORDER — MOXIFLOXACIN HCL 0.5 % OP SOLN
OPHTHALMIC | Status: DC | PRN
  Administered 2023-01-20: .2 mL via OPHTHALMIC

## 2023-01-20 MED ORDER — ARMC OPHTHALMIC DILATING DROPS
OPHTHALMIC | Status: AC
  Filled 2023-01-20: qty 0.5

## 2023-01-20 MED ORDER — TETRACAINE HCL 0.5 % OP SOLN
1.0000 [drp] | OPHTHALMIC | Status: DC | PRN
  Administered 2023-01-20 (×3): 1 [drp] via OPHTHALMIC

## 2023-01-20 MED ORDER — TETRACAINE HCL 0.5 % OP SOLN
OPHTHALMIC | Status: AC
Start: 2023-01-20 — End: ?
  Filled 2023-01-20: qty 4

## 2023-01-20 MED ORDER — FENTANYL CITRATE (PF) 100 MCG/2ML IJ SOLN
INTRAMUSCULAR | Status: DC | PRN
  Administered 2023-01-20: 25 ug via INTRAVENOUS
  Administered 2023-01-20: 50 ug via INTRAVENOUS

## 2023-01-20 MED ORDER — SIGHTPATH DOSE#1 BSS IO SOLN
INTRAOCULAR | Status: DC | PRN
  Administered 2023-01-20: 90 mL via OPHTHALMIC

## 2023-01-20 MED ORDER — FENTANYL CITRATE (PF) 100 MCG/2ML IJ SOLN
INTRAMUSCULAR | Status: AC
  Filled 2023-01-20: qty 2

## 2023-01-20 MED ORDER — MIDAZOLAM HCL 2 MG/2ML IJ SOLN
INTRAMUSCULAR | Status: AC
Start: 2023-01-20 — End: ?
  Filled 2023-01-20: qty 2

## 2023-01-20 MED ORDER — LIDOCAINE HCL (PF) 2 % IJ SOLN
INTRAOCULAR | Status: DC | PRN
  Administered 2023-01-20: 1 mL via INTRAOCULAR

## 2023-01-20 MED ORDER — SIGHTPATH DOSE#1 NA HYALUR & NA CHOND-NA HYALUR IO KIT
PACK | INTRAOCULAR | Status: DC | PRN
  Administered 2023-01-20: 1 via OPHTHALMIC

## 2023-01-20 MED ORDER — MIDAZOLAM HCL 2 MG/2ML IJ SOLN
INTRAMUSCULAR | Status: DC | PRN
  Administered 2023-01-20: 2 mg via INTRAVENOUS

## 2023-01-20 MED ORDER — ARMC OPHTHALMIC DILATING DROPS
1.0000 | OPHTHALMIC | Status: DC | PRN
  Administered 2023-01-20 (×3): 1 via OPHTHALMIC

## 2023-01-20 SURGICAL SUPPLY — 11 items
CATARACT SUITE SIGHTPATH (MISCELLANEOUS) ×1
DISSECTOR HYDRO NUCLEUS 50X22 (MISCELLANEOUS) ×1 IMPLANT
FEE CATARACT SUITE SIGHTPATH (MISCELLANEOUS) ×1 IMPLANT
GLOVE PI ULTRA LF STRL 7.5 (GLOVE) ×1 IMPLANT
GLOVE SURG POLYISOPRENE 8.5 (GLOVE) ×1
GLOVE SURG SYN 8.5 PF PI BL (GLOVE) ×1 IMPLANT
LENS IOL TECNIS EYHANCE 26.0 (Intraocular Lens) IMPLANT
NDL FILTER BLUNT 18X1 1/2 (NEEDLE) ×1 IMPLANT
NEEDLE FILTER BLUNT 18X1 1/2 (NEEDLE) ×1
SYR 3ML LL SCALE MARK (SYRINGE) ×1 IMPLANT
SYR 5ML LL (SYRINGE) ×1 IMPLANT

## 2023-01-20 NOTE — Anesthesia Postprocedure Evaluation (Signed)
Anesthesia Post Note  Patient: Heidi Knight  Procedure(s) Performed: CATARACT EXTRACTION PHACO AND INTRAOCULAR LENS PLACEMENT (IOC) LEFT  5.52  00:34.8 (Left: Eye)  Patient location during evaluation: PACU Anesthesia Type: MAC Level of consciousness: awake and alert Pain management: pain level controlled Vital Signs Assessment: post-procedure vital signs reviewed and stable Respiratory status: spontaneous breathing, nonlabored ventilation, respiratory function stable and patient connected to nasal cannula oxygen Cardiovascular status: stable and blood pressure returned to baseline Postop Assessment: no apparent nausea or vomiting Anesthetic complications: no   No notable events documented.   Last Vitals:  Vitals:   01/20/23 0922 01/20/23 0929  BP: 138/77 138/70  Pulse: 71 70  Resp: 12 15  Temp: (!) 36.1 C (!) 36.1 C  SpO2: 97% 96%    Last Pain:  Vitals:   01/20/23 0929  TempSrc:   PainSc: 0-No pain                 Marisue Humble

## 2023-01-20 NOTE — H&P (Signed)
[facility]   Primary Care Physician:  Danella Penton, MD Ophthalmologist: Dr. Willey Blade  Pre-Procedure History & Physical: HPI:  Heidi Knight is a 72 y.o. female here for cataract surgery.   Past Medical History:  Diagnosis Date   Anxiety    Colitis    Depression    Pneumonia    Presence of dental bridge    Permanent Upper Left   Wears contact lenses    Wears hearing aid in left ear     Past Surgical History:  Procedure Laterality Date   BREAST CYST ASPIRATION Left 1995   neg   COLONOSCOPY N/A 04/10/2022   Procedure: COLONOSCOPY;  Surgeon: Toledo, Boykin Nearing, MD;  Location: [facility] ENDOSCOPY;  Service: Gastroenterology;  Laterality: N/A;   COLONOSCOPY WITH PROPOFOL     REDUCTION MAMMAPLASTY  1999    Prior to Admission medications   Medication Sig Start Date End Date Taking? Authorizing Provider  acetaminophen (TYLENOL) 500 MG tablet Take 500 mg by mouth every 6 (six) hours as needed.   Yes [provider]  BIOTIN PO Take by mouth daily.   Yes [provider]  Calcium Carb-Cholecalciferol (CALCIUM 600 + D PO) Take 2 tablets by mouth daily.   Yes [provider]  cholecalciferol (VITAMIN D3) 10 MCG (400 UNIT) TABS tablet Take 2,000 Units by mouth.   Yes [provider]  cloNIDine (CATAPRES) 0.1 MG tablet Take 0.1 mg by mouth at bedtime as needed.   Yes [provider]  cyanocobalamin (VITAMIN B12) 1000 MCG tablet Take 1,000 mcg by mouth daily.   Yes [provider]  diphenhydrAMINE (BENADRYL) 50 MG capsule Take 50 mg by mouth at bedtime as needed.   Yes [provider]  estradiol (ESTRACE) 0.5 MG tablet Take 0.5 mg by mouth daily.   Yes [provider]  glucosamine-chondroitin 500-400 MG tablet Take 3 tablets by mouth daily.   Yes [provider]  loperamide (IMODIUM) 2 MG capsule Take 2 mg by mouth as needed for diarrhea or loose stools.   Yes [provider]  Melatonin 10 MG  CAPS Take 20 mg by mouth at bedtime.   Yes [provider]  Multiple Vitamin (MULTIVITAMIN) tablet Take 1 tablet by mouth daily.   Yes [provider]  ondansetron (ZOFRAN-ODT) 4 MG disintegrating tablet Take 1 tablet (4 mg total) by mouth every 8 (eight) hours as needed for nausea or vomiting. 08/02/22  Yes Minna Antis, MD  pantoprazole (PROTONIX) 40 MG tablet Take 40 mg by mouth daily.   Yes [provider]  progesterone (PROMETRIUM) 100 MG capsule Take 100 mg by mouth daily.   Yes [provider]  sertraline (ZOLOFT) 50 MG tablet Take 50 mg by mouth daily.   Yes [provider]    Allergies as of 12/23/2022 - Review Complete 08/02/2022  Allergen Reaction Noted   Doxycycline  04/10/2022    Family History  Problem Relation Age of Onset   Breast cancer Neg Hx     Social History   Socioeconomic History   Marital status: Married    Spouse name: Not on file   Number of children: Not on file   Years of education: Not on file   Highest education level: Not on file  Occupational History   Not on file  Tobacco Use   Smoking status: Every Day    Current packs/day: 0.35    Average packs/day: 0.4 packs/day for 10.9 years (3.8 ttl  pk-yrs)    Types: Cigarettes    Start date: 2014   Smokeless tobacco: Never  Vaping Use   Vaping status: Never Used  Substance and Sexual Activity   Alcohol use: Yes    Comment: social   Drug use: Never   Sexual activity: Not on file  Other Topics Concern   Not on file  Social History Narrative   Not on file   Social Determinants of Health   Financial Resource Strain: Low Risk  (08/06/2022)   Received from [facility], [facility]   Overall Financial Resource Strain (CARDIA)    Difficulty of Paying Living Expenses: Not hard at all  Food Insecurity: No Food Insecurity (08/06/2022)   Received from [facility], [facility]   Hunger  Vital Sign    Worried About Running Out of Food in the Last Year: Never true    Ran Out of Food in the Last Year: Never true  Transportation Needs: No Transportation Needs (08/06/2022)   Received from [facility], [facility]   [facility] - Transportation    In the past 12 months, has lack of transportation kept you from medical appointments or from getting medications?: No    Lack of Transportation (Non-Medical): No  Physical Activity: Not on file  Stress: Not on file  Social Connections: Not on file  Intimate Partner Violence: Not on file    Review of Systems: See HPI, otherwise negative ROS  Physical Exam: BP (!) 159/83   Pulse 68   Temp 97.9 F (36.6 C) (Temporal)   Ht 5' 0.98" (1.549 m)   Wt 49.9 kg   SpO2 96%   BMI 20.80 kg/m  General:   Alert, cooperative in NAD Head:  Normocephalic and atraumatic. Respiratory:  Normal work of breathing. Cardiovascular:  RRR  Impression/Plan: Heidi Knight is here for cataract surgery.  Risks, benefits, limitations, and alternatives regarding cataract surgery have been reviewed with the patient.  Questions have been answered.  All parties agreeable.   Willey Blade, MD  01/20/2023, 8:54 AM

## 2023-01-20 NOTE — Transfer of Care (Signed)
Immediate Anesthesia Transfer of Care Note  Patient: Heidi Knight  Procedure(s) Performed: CATARACT EXTRACTION PHACO AND INTRAOCULAR LENS PLACEMENT (IOC) LEFT  5.52  00:34.8 (Left: Eye)  Patient Location: PACU  Anesthesia Type: MAC  Level of Consciousness: awake, alert  and patient cooperative  Airway and Oxygen Therapy: Patient Spontanous Breathing and Patient connected to supplemental oxygen  Post-op Assessment: Post-op Vital signs reviewed, Patient's Cardiovascular Status Stable, Respiratory Function Stable, Patent Airway and No signs of Nausea or vomiting  Post-op Vital Signs: Reviewed and stable  Complications: No notable events documented.

## 2023-01-20 NOTE — Op Note (Signed)
OPERATIVE NOTE  Heidi Knight [id number] 01/20/2023   PREOPERATIVE DIAGNOSIS:  Nuclear sclerotic cataract left eye.  H25.12   POSTOPERATIVE DIAGNOSIS:    Nuclear sclerotic cataract left eye.     PROCEDURE:  Phacoemusification with posterior chamber intraocular lens placement of the left eye   LENS:   Implant Name Type Inv. Item Serial No. Manufacturer Lot No. LRB No. Used Action  LENS IOL TECNIS EYHANCE 26.0 - [id number] Intraocular Lens LENS IOL TECNIS EYHANCE 26.0 [id number] SIGHT[employer]  Left 1 Implanted      Procedure(s): CATARACT EXTRACTION PHACO AND INTRAOCULAR LENS PLACEMENT (IOC) LEFT  5.52  00:34.8 (Left)  SURGEON:  Willey Blade, MD, MPH   ANESTHESIA:  Topical with tetracaine drops augmented with 1% preservative-free intracameral lidocaine.  ESTIMATED BLOOD LOSS: <1 mL   COMPLICATIONS:  None.   DESCRIPTION OF PROCEDURE:  The patient was identified in the holding room and transported to the operating room and placed in the supine position under the operating microscope.  The left eye was identified as the operative eye and it was prepped and draped in the usual sterile ophthalmic fashion.   A 1.0 millimeter clear-corneal paracentesis was made at the 5:00 position. 0.5 ml of preservative-free 1% lidocaine with epinephrine was injected into the anterior chamber.  The anterior chamber was filled with viscoelastic.  A 2.4 millimeter keratome was used to make a near-clear corneal incision at the 2:00 position.  A curvilinear capsulorrhexis was made with a cystotome and capsulorrhexis forceps.  Balanced salt solution was used to hydrodissect and hydrodelineate the nucleus.   Phacoemulsification was then used in stop and chop fashion to remove the lens nucleus and epinucleus.  The remaining cortex was then removed using the irrigation and aspiration handpiece. Viscoelastic was then placed into the capsular bag to distend it for lens placement.  A lens was then injected into the  capsular bag.  The remaining viscoelastic was aspirated.   Wounds were hydrated with balanced salt solution.  The anterior chamber was inflated to a physiologic pressure with balanced salt solution.   Intracameral vigamox 0.1 mL undiltued was injected into the eye and a drop placed onto the ocular surface.  No wound leaks were noted.  The patient was taken to the recovery room in stable condition without complications of anesthesia or surgery  Willey Blade 01/20/2023, 9:20 AM

## 2023-01-21 ENCOUNTER — Encounter: Payer: Self-pay | Admitting: Ophthalmology

## 2023-02-03 ENCOUNTER — Other Ambulatory Visit: Payer: Self-pay | Admitting: Internal Medicine

## 2023-02-03 DIAGNOSIS — Z1231 Encounter for screening mammogram for malignant neoplasm of breast: Secondary | ICD-10-CM

## 2023-02-05 NOTE — Anesthesia Preprocedure Evaluation (Addendum)
Anesthesia Evaluation  Patient identified by MRN, date of birth, ID band Patient awake    Reviewed: Allergy & Precautions, H&P , NPO status , Patient's Chart, lab work & pertinent test results  Airway Mallampati: III  TM Distance: >3 FB Neck ROM: Full   Comment: 2 permanent Dental bridges and an implant right lower jaw Dental no notable dental hx.  2 permanent Dental bridges and an implant right lower jaw:   Pulmonary pneumonia, Current Smoker and Patient abstained from smoking.   Pulmonary exam normal breath sounds clear to auscultation       Cardiovascular negative cardio ROS Normal cardiovascular exam Rhythm:Regular Rate:Normal     Neuro/Psych  PSYCHIATRIC DISORDERS Anxiety Depression    negative neurological ROS  negative psych ROS   GI/Hepatic negative GI ROS, Neg liver ROS,,,  Endo/Other  negative endocrine ROS    Renal/GU negative Renal ROS  negative genitourinary   Musculoskeletal negative musculoskeletal ROS (+)    Abdominal   Peds negative pediatric ROS (+)  Hematology negative hematology ROS (+)   Anesthesia Other Findings Previous cataract surgery 01-20-23 with Dr. Juel Burrow anesthesiologist. Desiree Hane CRNA, Fentanyl 75 mcg IV and Versed 2 mg IV previously.  Patient doesn't recall previous cataract surgery and "I don't want to know" this time if possible. Discussed that she may have awareness, this is not general anesthesia, and she will also have to look up and look down at surgeon's behest, so she may have memory of event, but no pain.   Anxiety  Depression Colitis  Presence of dental bridge Pneumonia  Wears contact lenses Wears hearing aid in left ear     Reproductive/Obstetrics negative OB ROS                             Anesthesia Physical Anesthesia Plan  ASA: 2  Anesthesia Plan: MAC   Post-op Pain Management:    Induction: Intravenous  PONV Risk Score  and Plan:   Airway Management Planned: Natural Airway and Nasal Cannula  Additional Equipment:   Intra-op Plan:   Post-operative Plan:   Informed Consent: I have reviewed the patients History and Physical, chart, labs and discussed the procedure including the risks, benefits and alternatives for the proposed anesthesia with the patient or authorized representative who has indicated his/her understanding and acceptance.     Dental Advisory Given  Plan Discussed with: Anesthesiologist, CRNA and Surgeon  Anesthesia Plan Comments: (Patient consented for risks of anesthesia including but not limited to:  - adverse reactions to medications - damage to eyes, teeth, lips or other oral mucosa - nerve damage due to positioning  - sore throat or hoarseness - Damage to heart, brain, nerves, lungs, other parts of body or loss of life  Patient voiced understanding and assent.)        Anesthesia Quick Evaluation

## 2023-02-06 NOTE — Discharge Instructions (Signed)

## 2023-02-10 ENCOUNTER — Ambulatory Visit: Payer: Medicare HMO | Admitting: Anesthesiology

## 2023-02-10 ENCOUNTER — Encounter: Payer: Self-pay | Admitting: Ophthalmology

## 2023-02-10 ENCOUNTER — Ambulatory Visit
Admission: RE | Admit: 2023-02-10 | Discharge: 2023-02-10 | Disposition: A | Discharge status: Home / Self Care | Payer: Medicare HMO | Attending: Ophthalmology | Admitting: Ophthalmology

## 2023-02-10 ENCOUNTER — Encounter
Admission: RE | Disposition: A | Discharge status: Home / Self Care | Payer: Self-pay | Source: Home / Self Care | Attending: Ophthalmology

## 2023-02-10 ENCOUNTER — Other Ambulatory Visit: Payer: Self-pay

## 2023-02-10 DIAGNOSIS — F17210 Nicotine dependence, cigarettes, uncomplicated: Secondary | ICD-10-CM | POA: Insufficient documentation

## 2023-02-10 DIAGNOSIS — H2511 Age-related nuclear cataract, right eye: Secondary | ICD-10-CM | POA: Diagnosis present

## 2023-02-10 HISTORY — PX: CATARACT EXTRACTION W/PHACO: SHX586

## 2023-02-10 SURGERY — PHACOEMULSIFICATION, CATARACT, WITH IOL INSERTION
Anesthesia: Monitor Anesthesia Care | Site: Eye | Laterality: Right

## 2023-02-10 MED ORDER — FENTANYL CITRATE (PF) 100 MCG/2ML IJ SOLN
INTRAMUSCULAR | Status: DC | PRN
  Administered 2023-02-10: 100 ug via INTRAVENOUS

## 2023-02-10 MED ORDER — SIGHTPATH DOSE#1 NA HYALUR & NA CHOND-NA HYALUR IO KIT
PACK | INTRAOCULAR | Status: DC | PRN
  Administered 2023-02-10: 1 via OPHTHALMIC

## 2023-02-10 MED ORDER — TETRACAINE HCL 0.5 % OP SOLN
OPHTHALMIC | Status: AC
  Filled 2023-02-10: qty 4

## 2023-02-10 MED ORDER — ARMC OPHTHALMIC DILATING DROPS
1.0000 | OPHTHALMIC | Status: DC | PRN
  Administered 2023-02-10 (×3): 1 via OPHTHALMIC

## 2023-02-10 MED ORDER — MIDAZOLAM HCL 2 MG/2ML IJ SOLN
INTRAMUSCULAR | Status: AC
  Filled 2023-02-10: qty 2

## 2023-02-10 MED ORDER — TETRACAINE HCL 0.5 % OP SOLN
1.0000 [drp] | OPHTHALMIC | Status: DC | PRN
  Administered 2023-02-10 (×3): 1 [drp] via OPHTHALMIC

## 2023-02-10 MED ORDER — LIDOCAINE HCL (PF) 2 % IJ SOLN
INTRAOCULAR | Status: DC | PRN
  Administered 2023-02-10: 1 mL via INTRAOCULAR

## 2023-02-10 MED ORDER — MOXIFLOXACIN HCL 0.5 % OP SOLN
OPHTHALMIC | Status: DC | PRN
  Administered 2023-02-10: .2 mL via OPHTHALMIC

## 2023-02-10 MED ORDER — FENTANYL CITRATE (PF) 100 MCG/2ML IJ SOLN
INTRAMUSCULAR | Status: AC
  Filled 2023-02-10: qty 2

## 2023-02-10 MED ORDER — MIDAZOLAM HCL 2 MG/2ML IJ SOLN
INTRAMUSCULAR | Status: DC | PRN
  Administered 2023-02-10: 2 mg via INTRAVENOUS

## 2023-02-10 MED ORDER — SIGHTPATH DOSE#1 BSS IO SOLN
INTRAOCULAR | Status: DC | PRN
  Administered 2023-02-10: 74 mL via OPHTHALMIC

## 2023-02-10 MED ORDER — SIGHTPATH DOSE#1 BSS IO SOLN
INTRAOCULAR | Status: DC | PRN
  Administered 2023-02-10: 15 mL

## 2023-02-10 SURGICAL SUPPLY — 9 items
CATARACT SUITE SIGHTPATH (MISCELLANEOUS) ×1
FEE CATARACT SUITE SIGHTPATH (MISCELLANEOUS) ×1 IMPLANT
GLOVE PI ULTRA LF STRL 7.5 (GLOVE) ×1 IMPLANT
GLOVE SURG POLYISOPRENE 8.5 (GLOVE) ×1
GLOVE SURG SYN 8.5 PF PI BL (GLOVE) ×1 IMPLANT
LENS IOL TECNIS EYHANCE 28.0 (Intraocular Lens) IMPLANT
NDL FILTER BLUNT 18X1 1/2 (NEEDLE) ×1 IMPLANT
NEEDLE FILTER BLUNT 18X1 1/2 (NEEDLE) ×1
SYR 5ML LL (SYRINGE) ×1 IMPLANT

## 2023-02-10 NOTE — H&P (Signed)
[facility]   Primary Care Physician:  Danella Penton, MD Ophthalmologist: Dr. Willey Blade  Pre-Procedure History & Physical: HPI:  Heidi Knight is a 72 y.o. female here for cataract surgery.   Past Medical History:  Diagnosis Date   Anxiety    Colitis    Depression    Pneumonia    Presence of dental bridge    Permanent Upper Left   Wears contact lenses    Wears hearing aid in left ear     Past Surgical History:  Procedure Laterality Date   BREAST CYST ASPIRATION Left 1995   neg   CATARACT EXTRACTION W/PHACO Left 01/20/2023   Procedure: CATARACT EXTRACTION PHACO AND INTRAOCULAR LENS PLACEMENT (IOC) LEFT  5.52  00:34.8;  Surgeon: Nevada Crane, MD;  Location: [facility];  Service: Ophthalmology;  Laterality: Left;   COLONOSCOPY N/A 04/10/2022   Procedure: COLONOSCOPY;  Surgeon: Toledo, Boykin Nearing, MD;  Location: [facility] END[facility];  Service: Gastroenterology;  Laterality: N/A;   COLONOSCOPY WITH PROPOFOL     REDUCTION MAMMAPLASTY  1999    Prior to Admission medications   Medication Sig Start Date End Date Taking? Authorizing Provider  acetaminophen (TYLENOL) 500 MG tablet Take 500 mg by mouth every 6 (six) hours as needed.   Yes [provider]  BIOTIN PO Take by mouth daily.   Yes [provider]  Calcium Carb-Cholecalciferol (CALCIUM 600 + D PO) Take 2 tablets by mouth daily.   Yes [provider]  cholecalciferol (VITAMIN D3) 10 MCG (400 UNIT) TABS tablet Take 2,000 Units by mouth.   Yes [provider]  cloNIDine (CATAPRES) 0.1 MG tablet Take 0.1 mg by mouth at bedtime as needed.   Yes [provider]  cyanocobalamin (VITAMIN B12) 1000 MCG tablet Take 1,000 mcg by mouth daily.   Yes [provider]  diphenhydrAMINE (BENADRYL) 50 MG capsule Take 50 mg by mouth at bedtime as needed.   Yes [provider]  estradiol (ESTRACE) 0.5 MG tablet Take 0.5 mg by mouth daily.   Yes [provider]  glucosamine-chondroitin 500-400 MG tablet Take 3 tablets by mouth daily.   Yes [provider]  loperamide (IMODIUM) 2 MG capsule Take 2 mg by mouth as needed for diarrhea or loose stools.   Yes [provider]  Melatonin 10 MG CAPS Take 20 mg by mouth at bedtime.   Yes [provider]  Multiple Vitamin (MULTIVITAMIN) tablet Take 1 tablet by mouth daily.   Yes [provider]  ondansetron (ZOFRAN-ODT) 4 MG disintegrating tablet Take 1 tablet (4 mg total) by mouth every 8 (eight) hours as needed for nausea or vomiting. 08/02/22  Yes Minna Antis, MD  pantoprazole (PROTONIX) 40 MG tablet Take 40 mg by mouth daily.   Yes [provider]  progesterone (PROMETRIUM) 100 MG capsule Take 100 mg by mouth daily.   Yes [provider]  sertraline (ZOLOFT) 50 MG tablet Take 50 mg by mouth daily.   Yes [provider]    Allergies as of 12/23/2022 - Review Complete 08/02/2022  Allergen Reaction Noted   Doxycycline  04/10/2022    Family History  Problem Relation Age of Onset   Breast cancer Neg Hx     Social History   Socioeconomic History   Marital status: Married    Spouse name: Not on file   Number of children: Not on file   Years of education: Not on file   Highest  education level: Not on file  Occupational History   Not on file  Tobacco Use   Smoking status: Every Day    Current packs/day: 0.35    Average packs/day: 0.4 packs/day for 11.0 years (3.8 ttl pk-yrs)    Types: Cigarettes    Start date: 2014   Smokeless tobacco: Never  Vaping Use   Vaping status: Never Used  Substance and Sexual Activity   Alcohol use: Yes    Comment: social   Drug use: Never   Sexual activity: Not on file  Other Topics Concern   Not on file  Social History Narrative   Not on file   Social Drivers of Health   Financial Resource Strain: Low Risk  (08/06/2022)   Received from [facility], [facility]   Overall Financial Resource Strain (CARDIA)    Difficulty of Paying Living Expenses: Not hard at all  Food Insecurity: No Food Insecurity (08/06/2022)   Received from [facility], [facility]   Hunger Vital Sign    Worried About Running Out of Food in the Last Year: Never true    Ran Out of Food in the Last Year: Never true  Transportation Needs: No Transportation Needs (08/06/2022)   Received from [facility], [facility]   [facility] - Transportation    In the past 12 months, has lack of transportation kept you from medical appointments or from getting medications?: No    Lack of Transportation (Non-Medical): No  Physical Activity: Not on file  Stress: Not on file  Social Connections: Not on file  Intimate Partner Violence: Not on file    Review of Systems: See HPI, otherwise negative ROS  Physical Exam: BP (!) 142/79   Pulse 83   Temp (!) 97.4 F (36.3 C) (Temporal)   Resp 18   Ht 5' 1.5" (1.562 m)   Wt 49.9 kg   SpO2 99%   BMI 20.45 kg/m  General:   Alert, cooperative in NAD Head:  Normocephalic and atraumatic. Respiratory:  Normal work of breathing. Cardiovascular:  RRR  Impression/Plan: Heidi Knight is here for cataract surgery.  Risks, benefits, limitations, and alternatives regarding cataract surgery have been reviewed with the patient.  Questions have been answered.  All parties agreeable.   Willey Blade, MD  02/10/2023, 10:23 AM

## 2023-02-10 NOTE — Op Note (Signed)
OPERATIVE NOTE  MICHAL FIRPO [id number] 02/10/2023   PREOPERATIVE DIAGNOSIS:  Nuclear sclerotic cataract right eye.  H25.11   POSTOPERATIVE DIAGNOSIS:    Nuclear sclerotic cataract right eye.     PROCEDURE:  Phacoemusification with posterior chamber intraocular lens placement of the right eye   LENS:   Implant Name Type Inv. Item Serial No. Manufacturer Lot No. LRB No. Used Action  LENS IOL TECNIS EYHANCE 28.0 - [id number] Intraocular Lens LENS IOL TECNIS EYHANCE 28.0 [id number] [employer]  Right 1 Implanted       Procedure(s): CATARACT EXTRACTION PHACO AND INTRAOCULAR LENS PLACEMENT (IOC) RIGHT  5.53  00:36.0 (Right)   SURGEON:  Willey Blade, MD, MPH  ANESTHESIOLOGIST: Anesthesiologist: Marisue Humble, MD CRNA: Domenic Moras, CRNA   ANESTHESIA:  Topical with tetracaine drops augmented with 1% preservative-free intracameral lidocaine.  ESTIMATED BLOOD LOSS: less than 1 mL.   COMPLICATIONS:  None.   DESCRIPTION OF PROCEDURE:  The patient was identified in the holding room and transported to the operating room and placed in the supine position under the operating microscope.  The right eye was identified as the operative eye and it was prepped and draped in the usual sterile ophthalmic fashion.   A 1.0 millimeter clear-corneal paracentesis was made at the 10:30 position. 0.5 ml of preservative-free 1% lidocaine with epinephrine was injected into the anterior chamber.  The anterior chamber was filled with viscoelastic.  A 2.4 millimeter keratome was used to make a near-clear corneal incision at the 8:00 position.  A curvilinear capsulorrhexis was made with a cystotome and capsulorrhexis forceps.  Balanced salt solution was used to hydrodissect and hydrodelineate the nucleus.   Phacoemulsification was then used in stop and chop fashion to remove the lens nucleus and epinucleus.  The remaining cortex was then removed using the irrigation and aspiration handpiece. Viscoelastic  was then placed into the capsular bag to distend it for lens placement.  A lens was then injected into the capsular bag.  The remaining viscoelastic was aspirated.   Wounds were hydrated with balanced salt solution.  The anterior chamber was inflated to a physiologic pressure with balanced salt solution.    Intracameral vigamox 0.1 mL undiluted was injected into the eye and a drop placed onto the ocular surface.  No wound leaks were noted.  The patient was taken to the recovery room in stable condition without complications of anesthesia or surgery  Willey Blade 02/10/2023, 10:47 AM

## 2023-02-10 NOTE — Anesthesia Postprocedure Evaluation (Signed)
Anesthesia Post Note  Patient: Heidi Knight  Procedure(s) Performed: CATARACT EXTRACTION PHACO AND INTRAOCULAR LENS PLACEMENT (IOC) RIGHT  5.53  00:36.0 (Right: Eye)  Patient location during evaluation: PACU Anesthesia Type: MAC Level of consciousness: awake and alert Pain management: pain level controlled Vital Signs Assessment: post-procedure vital signs reviewed and stable Respiratory status: spontaneous breathing, nonlabored ventilation, respiratory function stable and patient connected to nasal cannula oxygen Cardiovascular status: stable and blood pressure returned to baseline Postop Assessment: no apparent nausea or vomiting Anesthetic complications: no   No notable events documented.   Last Vitals:  Vitals:   02/10/23 1053 02/10/23 1054  BP: 137/73   Pulse: 74   Resp: 12 14  Temp:    SpO2: 96%     Last Pain:  Vitals:   02/10/23 1053  TempSrc:   PainSc: 0-No pain                 Laraina Sulton C Jerris Keltz

## 2023-02-10 NOTE — Transfer of Care (Signed)
Immediate Anesthesia Transfer of Care Note  Patient: Heidi Knight  Procedure(s) Performed: CATARACT EXTRACTION PHACO AND INTRAOCULAR LENS PLACEMENT (IOC) RIGHT  5.53  00:36.0 (Right: Eye)  Patient Location: PACU  Anesthesia Type: MAC  Level of Consciousness: awake, alert  and patient cooperative  Airway and Oxygen Therapy: Patient Spontanous Breathing and Patient connected to supplemental oxygen  Post-op Assessment: Post-op Vital signs reviewed, Patient's Cardiovascular Status Stable, Respiratory Function Stable, Patent Airway and No signs of Nausea or vomiting  Post-op Vital Signs: Reviewed and stable  Complications: No notable events documented.

## 2023-02-11 ENCOUNTER — Encounter: Payer: Self-pay | Admitting: Ophthalmology

## 2023-03-04 DIAGNOSIS — H906 Mixed conductive and sensorineural hearing loss, bilateral: Secondary | ICD-10-CM | POA: Diagnosis not present

## 2023-03-04 DIAGNOSIS — H6981 Other specified disorders of Eustachian tube, right ear: Secondary | ICD-10-CM | POA: Diagnosis not present

## 2023-03-14 DIAGNOSIS — Z961 Presence of intraocular lens: Secondary | ICD-10-CM | POA: Diagnosis not present

## 2023-03-18 ENCOUNTER — Ambulatory Visit
Admission: RE | Admit: 2023-03-18 | Discharge: 2023-03-18 | Disposition: A | Discharge status: Home / Self Care | Payer: PPO | Source: Ambulatory Visit | Attending: Internal Medicine | Admitting: Internal Medicine

## 2023-03-18 DIAGNOSIS — Z1231 Encounter for screening mammogram for malignant neoplasm of breast: Secondary | ICD-10-CM | POA: Diagnosis not present

## 2023-03-18 DIAGNOSIS — M818 Other osteoporosis without current pathological fracture: Secondary | ICD-10-CM | POA: Diagnosis not present

## 2023-03-18 DIAGNOSIS — E782 Mixed hyperlipidemia: Secondary | ICD-10-CM | POA: Diagnosis not present

## 2023-03-18 DIAGNOSIS — Z79899 Other long term (current) drug therapy: Secondary | ICD-10-CM | POA: Diagnosis not present

## 2023-03-18 DIAGNOSIS — R739 Hyperglycemia, unspecified: Secondary | ICD-10-CM | POA: Diagnosis not present

## 2023-03-20 ENCOUNTER — Other Ambulatory Visit: Payer: Self-pay | Admitting: Internal Medicine

## 2023-03-20 DIAGNOSIS — R928 Other abnormal and inconclusive findings on diagnostic imaging of breast: Secondary | ICD-10-CM

## 2023-03-21 ENCOUNTER — Ambulatory Visit
Admission: RE | Admit: 2023-03-21 | Discharge: 2023-03-21 | Disposition: A | Discharge status: Home / Self Care | Payer: PPO | Source: Ambulatory Visit | Attending: Internal Medicine | Admitting: Internal Medicine

## 2023-03-21 DIAGNOSIS — R928 Other abnormal and inconclusive findings on diagnostic imaging of breast: Secondary | ICD-10-CM | POA: Insufficient documentation

## 2023-03-21 DIAGNOSIS — R92333 Mammographic heterogeneous density, bilateral breasts: Secondary | ICD-10-CM | POA: Diagnosis not present

## 2023-03-21 DIAGNOSIS — R921 Mammographic calcification found on diagnostic imaging of breast: Secondary | ICD-10-CM | POA: Diagnosis not present

## 2023-03-25 DIAGNOSIS — M818 Other osteoporosis without current pathological fracture: Secondary | ICD-10-CM | POA: Diagnosis not present

## 2023-03-25 DIAGNOSIS — F339 Major depressive disorder, recurrent, unspecified: Secondary | ICD-10-CM | POA: Diagnosis not present

## 2023-03-25 DIAGNOSIS — F330 Major depressive disorder, recurrent, mild: Secondary | ICD-10-CM | POA: Diagnosis not present

## 2023-03-25 DIAGNOSIS — E782 Mixed hyperlipidemia: Secondary | ICD-10-CM | POA: Diagnosis not present

## 2023-03-25 DIAGNOSIS — Z0001 Encounter for general adult medical examination with abnormal findings: Secondary | ICD-10-CM | POA: Diagnosis not present

## 2023-03-25 DIAGNOSIS — Z0000 Encounter for general adult medical examination without abnormal findings: Secondary | ICD-10-CM | POA: Diagnosis not present

## 2023-03-25 DIAGNOSIS — R55 Syncope and collapse: Secondary | ICD-10-CM | POA: Diagnosis not present

## 2023-04-01 DIAGNOSIS — R55 Syncope and collapse: Secondary | ICD-10-CM | POA: Diagnosis not present

## 2023-04-08 DIAGNOSIS — I471 Supraventricular tachycardia, unspecified: Secondary | ICD-10-CM | POA: Diagnosis not present

## 2023-04-08 DIAGNOSIS — K52831 Collagenous colitis: Secondary | ICD-10-CM | POA: Diagnosis not present

## 2023-04-08 DIAGNOSIS — F339 Major depressive disorder, recurrent, unspecified: Secondary | ICD-10-CM | POA: Diagnosis not present

## 2023-05-27 DIAGNOSIS — H6981 Other specified disorders of Eustachian tube, right ear: Secondary | ICD-10-CM | POA: Diagnosis not present

## 2023-06-03 DIAGNOSIS — G43109 Migraine with aura, not intractable, without status migrainosus: Secondary | ICD-10-CM | POA: Diagnosis not present

## 2023-06-03 DIAGNOSIS — H5213 Myopia, bilateral: Secondary | ICD-10-CM | POA: Diagnosis not present

## 2023-06-03 DIAGNOSIS — Z961 Presence of intraocular lens: Secondary | ICD-10-CM | POA: Diagnosis not present

## 2023-06-03 DIAGNOSIS — H524 Presbyopia: Secondary | ICD-10-CM | POA: Diagnosis not present

## 2023-06-03 DIAGNOSIS — H52223 Regular astigmatism, bilateral: Secondary | ICD-10-CM | POA: Diagnosis not present

## 2023-06-03 DIAGNOSIS — H43811 Vitreous degeneration, right eye: Secondary | ICD-10-CM | POA: Diagnosis not present

## 2023-06-03 DIAGNOSIS — H35363 Drusen (degenerative) of macula, bilateral: Secondary | ICD-10-CM | POA: Diagnosis not present

## 2023-11-25 DIAGNOSIS — H906 Mixed conductive and sensorineural hearing loss, bilateral: Secondary | ICD-10-CM | POA: Diagnosis not present

## 2023-11-25 DIAGNOSIS — H6123 Impacted cerumen, bilateral: Secondary | ICD-10-CM | POA: Diagnosis not present

## 2023-11-25 DIAGNOSIS — H6983 Other specified disorders of Eustachian tube, bilateral: Secondary | ICD-10-CM | POA: Diagnosis not present

## 2023-11-25 DIAGNOSIS — H7012 Chronic mastoiditis, left ear: Secondary | ICD-10-CM | POA: Diagnosis not present

## 2024-01-05 ENCOUNTER — Other Ambulatory Visit: Payer: Self-pay | Admitting: Internal Medicine

## 2024-01-05 DIAGNOSIS — Z1231 Encounter for screening mammogram for malignant neoplasm of breast: Secondary | ICD-10-CM

## 2024-03-23 ENCOUNTER — Encounter

## 2024-03-26 ENCOUNTER — Ambulatory Visit
Admission: RE | Admit: 2024-03-26 | Discharge: 2024-03-26 | Disposition: A | Source: Ambulatory Visit | Attending: Internal Medicine | Admitting: Internal Medicine

## 2024-03-26 DIAGNOSIS — Z1231 Encounter for screening mammogram for malignant neoplasm of breast: Secondary | ICD-10-CM | POA: Diagnosis present
